# Patient Record
Sex: Female | Born: 1950 | Race: White | Hispanic: No | Smoking: Former smoker
Health system: Southern US, Community
[De-identification: ages and names within clinical notes are randomized; demographics above are authoritative.]

## PROBLEM LIST (undated history)

## (undated) DIAGNOSIS — M25569 Pain in unspecified knee: Secondary | ICD-10-CM

## (undated) DIAGNOSIS — R29898 Other symptoms and signs involving the musculoskeletal system: Secondary | ICD-10-CM

## (undated) DIAGNOSIS — R809 Proteinuria, unspecified: Secondary | ICD-10-CM

## (undated) DIAGNOSIS — G43009 Migraine without aura, not intractable, without status migrainosus: Secondary | ICD-10-CM

## (undated) DIAGNOSIS — F419 Anxiety disorder, unspecified: Secondary | ICD-10-CM

## (undated) DIAGNOSIS — M1990 Unspecified osteoarthritis, unspecified site: Secondary | ICD-10-CM

## (undated) DIAGNOSIS — N281 Cyst of kidney, acquired: Secondary | ICD-10-CM

## (undated) DIAGNOSIS — F329 Major depressive disorder, single episode, unspecified: Secondary | ICD-10-CM

## (undated) DIAGNOSIS — J40 Bronchitis, not specified as acute or chronic: Secondary | ICD-10-CM

## (undated) DIAGNOSIS — C801 Malignant (primary) neoplasm, unspecified: Secondary | ICD-10-CM

## (undated) DIAGNOSIS — T7840XA Allergy, unspecified, initial encounter: Secondary | ICD-10-CM

## (undated) DIAGNOSIS — M545 Low back pain, unspecified: Secondary | ICD-10-CM

## (undated) DIAGNOSIS — F32A Depression, unspecified: Secondary | ICD-10-CM

## (undated) DIAGNOSIS — J45909 Unspecified asthma, uncomplicated: Secondary | ICD-10-CM

## (undated) DIAGNOSIS — E785 Hyperlipidemia, unspecified: Secondary | ICD-10-CM

## (undated) DIAGNOSIS — L03317 Cellulitis of buttock: Secondary | ICD-10-CM

## (undated) DIAGNOSIS — M25519 Pain in unspecified shoulder: Secondary | ICD-10-CM

## (undated) DIAGNOSIS — M542 Cervicalgia: Secondary | ICD-10-CM

## (undated) DIAGNOSIS — I10 Essential (primary) hypertension: Secondary | ICD-10-CM

## (undated) DIAGNOSIS — G4700 Insomnia, unspecified: Secondary | ICD-10-CM

## (undated) DIAGNOSIS — R413 Other amnesia: Secondary | ICD-10-CM

## (undated) DIAGNOSIS — L0231 Cutaneous abscess of buttock: Secondary | ICD-10-CM

## (undated) HISTORY — DX: Hyperlipidemia, unspecified: E78.5

## (undated) HISTORY — PX: CYSTECTOMY: SUR359

## (undated) HISTORY — PX: CARPAL TUNNEL RELEASE: SHX101

## (undated) HISTORY — DX: Cutaneous abscess of buttock: L02.31

## (undated) HISTORY — PX: FOOT SURGERY: SHX648

## (undated) HISTORY — DX: Major depressive disorder, single episode, unspecified: F32.9

## (undated) HISTORY — PX: TOTAL KNEE ARTHROPLASTY: SHX125

## (undated) HISTORY — PX: TUBAL LIGATION: SHX77

## (undated) HISTORY — PX: EYE SURGERY: SHX253

## (undated) HISTORY — DX: Low back pain, unspecified: M54.50

## (undated) HISTORY — DX: Cervicalgia: M54.2

## (undated) HISTORY — DX: Allergy, unspecified, initial encounter: T78.40XA

## (undated) HISTORY — DX: Depression, unspecified: F32.A

## (undated) HISTORY — PX: CATARACT EXTRACTION: SUR2

## (undated) HISTORY — DX: Other amnesia: R41.3

## (undated) HISTORY — DX: Unspecified asthma, uncomplicated: J45.909

## (undated) HISTORY — DX: Cyst of kidney, acquired: N28.1

## (undated) HISTORY — PX: KIDNEY SURGERY: SHX687

## (undated) HISTORY — PX: COLONOSCOPY: SHX174

## (undated) HISTORY — DX: Proteinuria, unspecified: R80.9

## (undated) HISTORY — DX: Other symptoms and signs involving the musculoskeletal system: R29.898

## (undated) HISTORY — PX: OTHER SURGICAL HISTORY: SHX169

## (undated) HISTORY — DX: Low back pain: M54.5

## (undated) HISTORY — DX: Unspecified osteoarthritis, unspecified site: M19.90

## (undated) HISTORY — PX: ABDOMINAL HYSTERECTOMY: SHX81

## (undated) HISTORY — DX: Pain in unspecified knee: M25.569

## (undated) HISTORY — DX: Cellulitis of buttock: L03.317

## (undated) HISTORY — DX: Insomnia, unspecified: G47.00

## (undated) HISTORY — DX: Anxiety disorder, unspecified: F41.9

## (undated) HISTORY — DX: Bronchitis, not specified as acute or chronic: J40

## (undated) HISTORY — DX: Migraine without aura, not intractable, without status migrainosus: G43.009

## (undated) HISTORY — DX: Essential (primary) hypertension: I10

## (undated) HISTORY — DX: Pain in unspecified shoulder: M25.519

---

## 1998-08-06 ENCOUNTER — Other Ambulatory Visit: Admission: RE | Admit: 1998-08-06 | Discharge: 1998-08-06 | Payer: Self-pay | Admitting: Family Medicine

## 2000-12-07 ENCOUNTER — Ambulatory Visit (HOSPITAL_BASED_OUTPATIENT_CLINIC_OR_DEPARTMENT_OTHER): Admission: RE | Admit: 2000-12-07 | Discharge: 2000-12-07 | Payer: Self-pay | Admitting: Surgery

## 2000-12-07 ENCOUNTER — Encounter (INDEPENDENT_AMBULATORY_CARE_PROVIDER_SITE_OTHER): Payer: Self-pay | Admitting: *Deleted

## 2002-02-07 ENCOUNTER — Encounter: Payer: Self-pay | Admitting: Emergency Medicine

## 2002-02-08 ENCOUNTER — Inpatient Hospital Stay (HOSPITAL_COMMUNITY): Admission: EM | Admit: 2002-02-08 | Discharge: 2002-02-09 | Payer: Self-pay | Admitting: Emergency Medicine

## 2004-10-14 ENCOUNTER — Inpatient Hospital Stay (HOSPITAL_COMMUNITY): Admission: RE | Admit: 2004-10-14 | Discharge: 2004-10-17 | Payer: Self-pay | Admitting: Orthopedic Surgery

## 2005-08-25 ENCOUNTER — Ambulatory Visit (HOSPITAL_COMMUNITY): Admission: RE | Admit: 2005-08-25 | Discharge: 2005-08-25 | Payer: Self-pay | Admitting: Family Medicine

## 2005-09-10 ENCOUNTER — Ambulatory Visit (HOSPITAL_COMMUNITY): Admission: RE | Admit: 2005-09-10 | Discharge: 2005-09-10 | Payer: Self-pay | Admitting: Family Medicine

## 2006-08-10 ENCOUNTER — Encounter (INDEPENDENT_AMBULATORY_CARE_PROVIDER_SITE_OTHER): Payer: Self-pay | Admitting: Specialist

## 2006-08-10 ENCOUNTER — Ambulatory Visit (HOSPITAL_COMMUNITY): Admission: RE | Admit: 2006-08-10 | Discharge: 2006-08-10 | Payer: Self-pay | Admitting: Urology

## 2006-08-31 ENCOUNTER — Emergency Department (HOSPITAL_COMMUNITY): Admission: EM | Admit: 2006-08-31 | Discharge: 2006-08-31 | Payer: Self-pay | Admitting: Emergency Medicine

## 2006-10-18 ENCOUNTER — Emergency Department (HOSPITAL_COMMUNITY): Admission: EM | Admit: 2006-10-18 | Discharge: 2006-10-18 | Payer: Self-pay | Admitting: Emergency Medicine

## 2006-11-06 ENCOUNTER — Encounter (INDEPENDENT_AMBULATORY_CARE_PROVIDER_SITE_OTHER): Payer: Self-pay | Admitting: Interventional Radiology

## 2006-11-06 ENCOUNTER — Ambulatory Visit (HOSPITAL_COMMUNITY): Admission: RE | Admit: 2006-11-06 | Discharge: 2006-11-06 | Payer: Self-pay | Admitting: Urology

## 2006-12-01 ENCOUNTER — Encounter: Admission: RE | Admit: 2006-12-01 | Discharge: 2006-12-01 | Payer: Self-pay | Admitting: Interventional Radiology

## 2006-12-30 ENCOUNTER — Ambulatory Visit (HOSPITAL_COMMUNITY): Admission: RE | Admit: 2006-12-30 | Discharge: 2006-12-30 | Payer: Self-pay | Admitting: Urology

## 2007-06-17 HISTORY — PX: CERVICAL FUSION: SHX112

## 2007-11-18 ENCOUNTER — Emergency Department (HOSPITAL_COMMUNITY): Admission: EM | Admit: 2007-11-18 | Discharge: 2007-11-18 | Payer: Self-pay | Admitting: Emergency Medicine

## 2008-01-04 ENCOUNTER — Encounter: Admission: RE | Admit: 2008-01-04 | Discharge: 2008-01-04 | Payer: Self-pay | Admitting: Internal Medicine

## 2008-03-26 ENCOUNTER — Emergency Department (HOSPITAL_COMMUNITY): Admission: EM | Admit: 2008-03-26 | Discharge: 2008-03-26 | Payer: Self-pay | Admitting: Emergency Medicine

## 2008-04-01 ENCOUNTER — Encounter: Admission: RE | Admit: 2008-04-01 | Discharge: 2008-04-01 | Payer: Self-pay | Admitting: Family Medicine

## 2008-05-24 ENCOUNTER — Inpatient Hospital Stay (HOSPITAL_COMMUNITY): Admission: RE | Admit: 2008-05-24 | Discharge: 2008-05-26 | Payer: Self-pay | Admitting: Neurosurgery

## 2008-09-26 ENCOUNTER — Encounter: Admission: RE | Admit: 2008-09-26 | Discharge: 2008-09-26 | Payer: Self-pay | Admitting: Family Medicine

## 2009-06-16 HISTORY — PX: SPINE SURGERY: SHX786

## 2009-08-18 ENCOUNTER — Emergency Department (HOSPITAL_COMMUNITY): Admission: EM | Admit: 2009-08-18 | Discharge: 2009-08-18 | Payer: Self-pay | Admitting: Emergency Medicine

## 2009-10-16 IMAGING — CR DG CERVICAL SPINE 2 OR 3 VIEWS
1 series · 1 of 1 positions shown · non-contrast
Comparison: none

CLINICAL DATA: Cervical fusion

CERVICAL SPINE - 2-3 VIEW

[view not recorded]
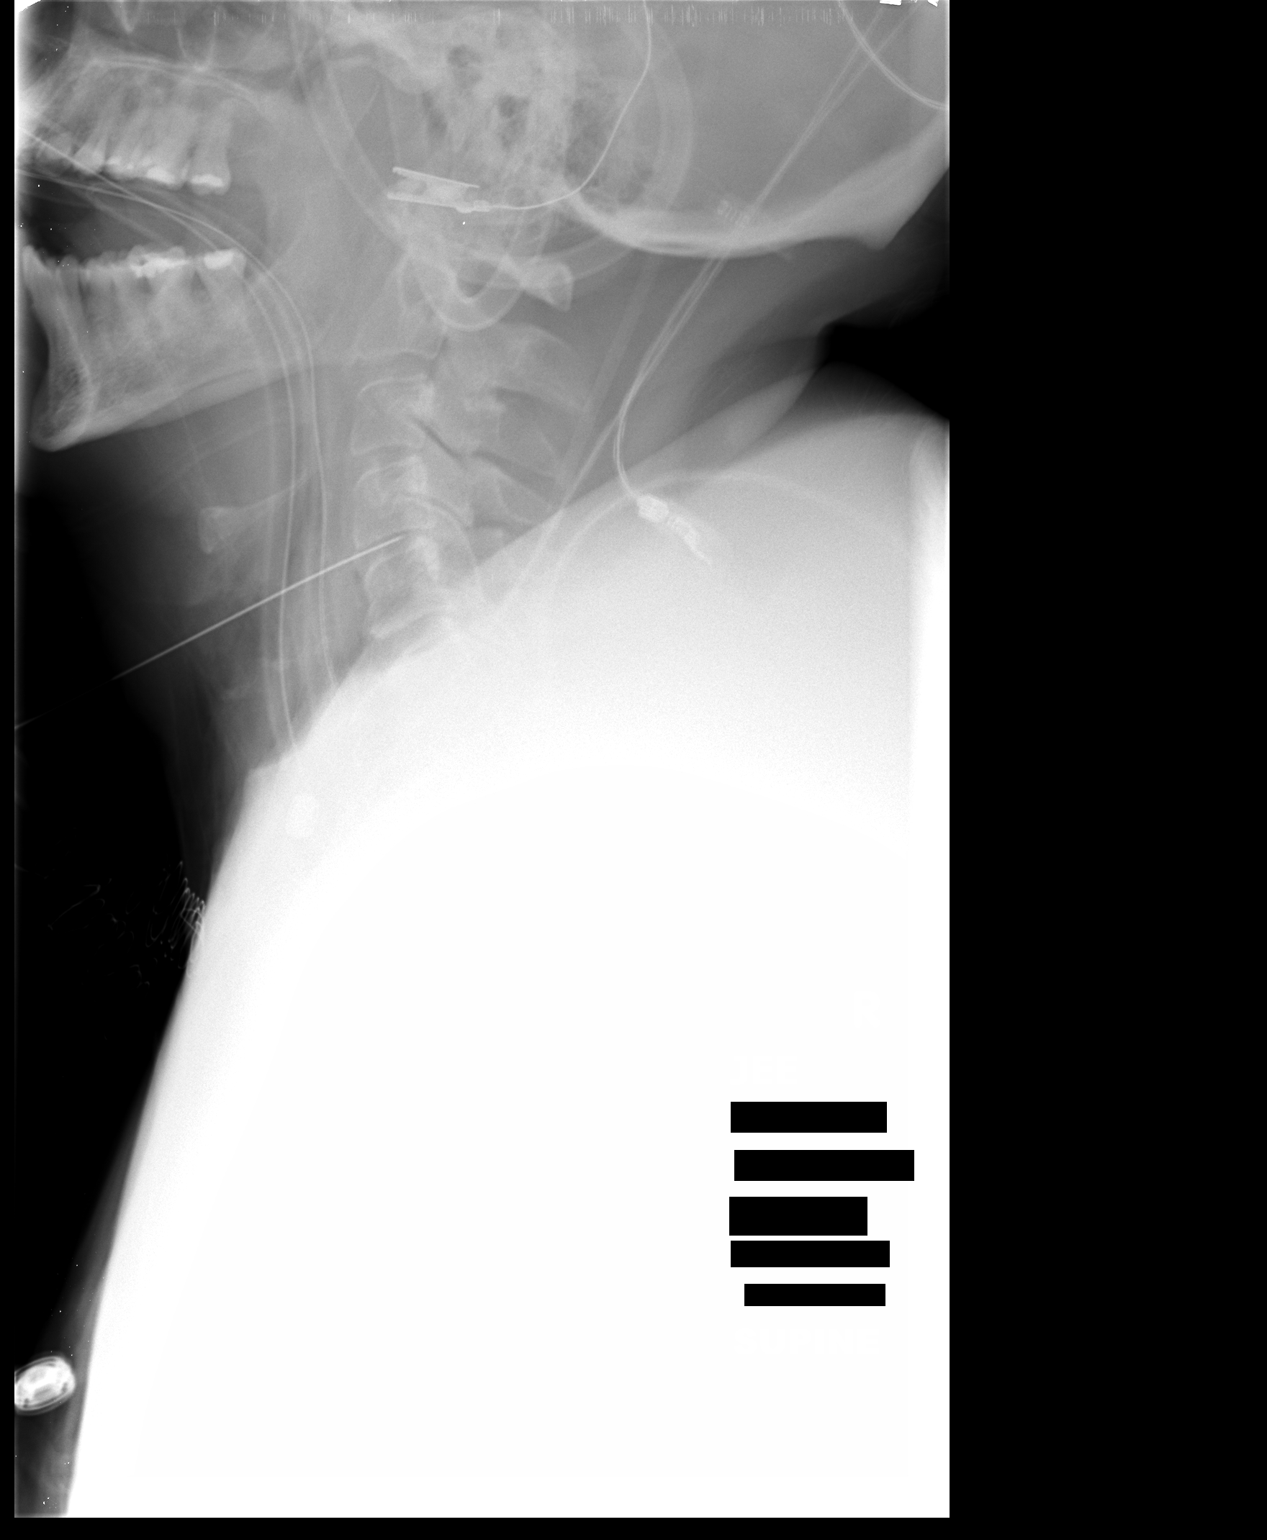

[1 of 1 positions shown; findings below may reference images not displayed]

FINDINGS: On the initial film, a needle has been utilized to mark
the C4-5 disc.  On the second film, plates and screws are seen in
C4-C5 and C6.  The plate extends toward C7 but the vertebral body
is obscured.  Interposed tubular bone graft is present in the C4-5
disc. Anatomic alignment.  No breakage of the hardware.
IMPRESSION: Anterior fusion C4-C7.

## 2009-10-16 IMAGING — CR DG CHEST 2V
2 series · 2 of 2 positions shown · non-contrast
Comparison: 10/10/2004

CLINICAL DATA: Preoperative respiratory exam.  Cervical spondylosis
and cervical radiculopathy.

CHEST - 2 VIEW

[view not recorded (1 of 2)]
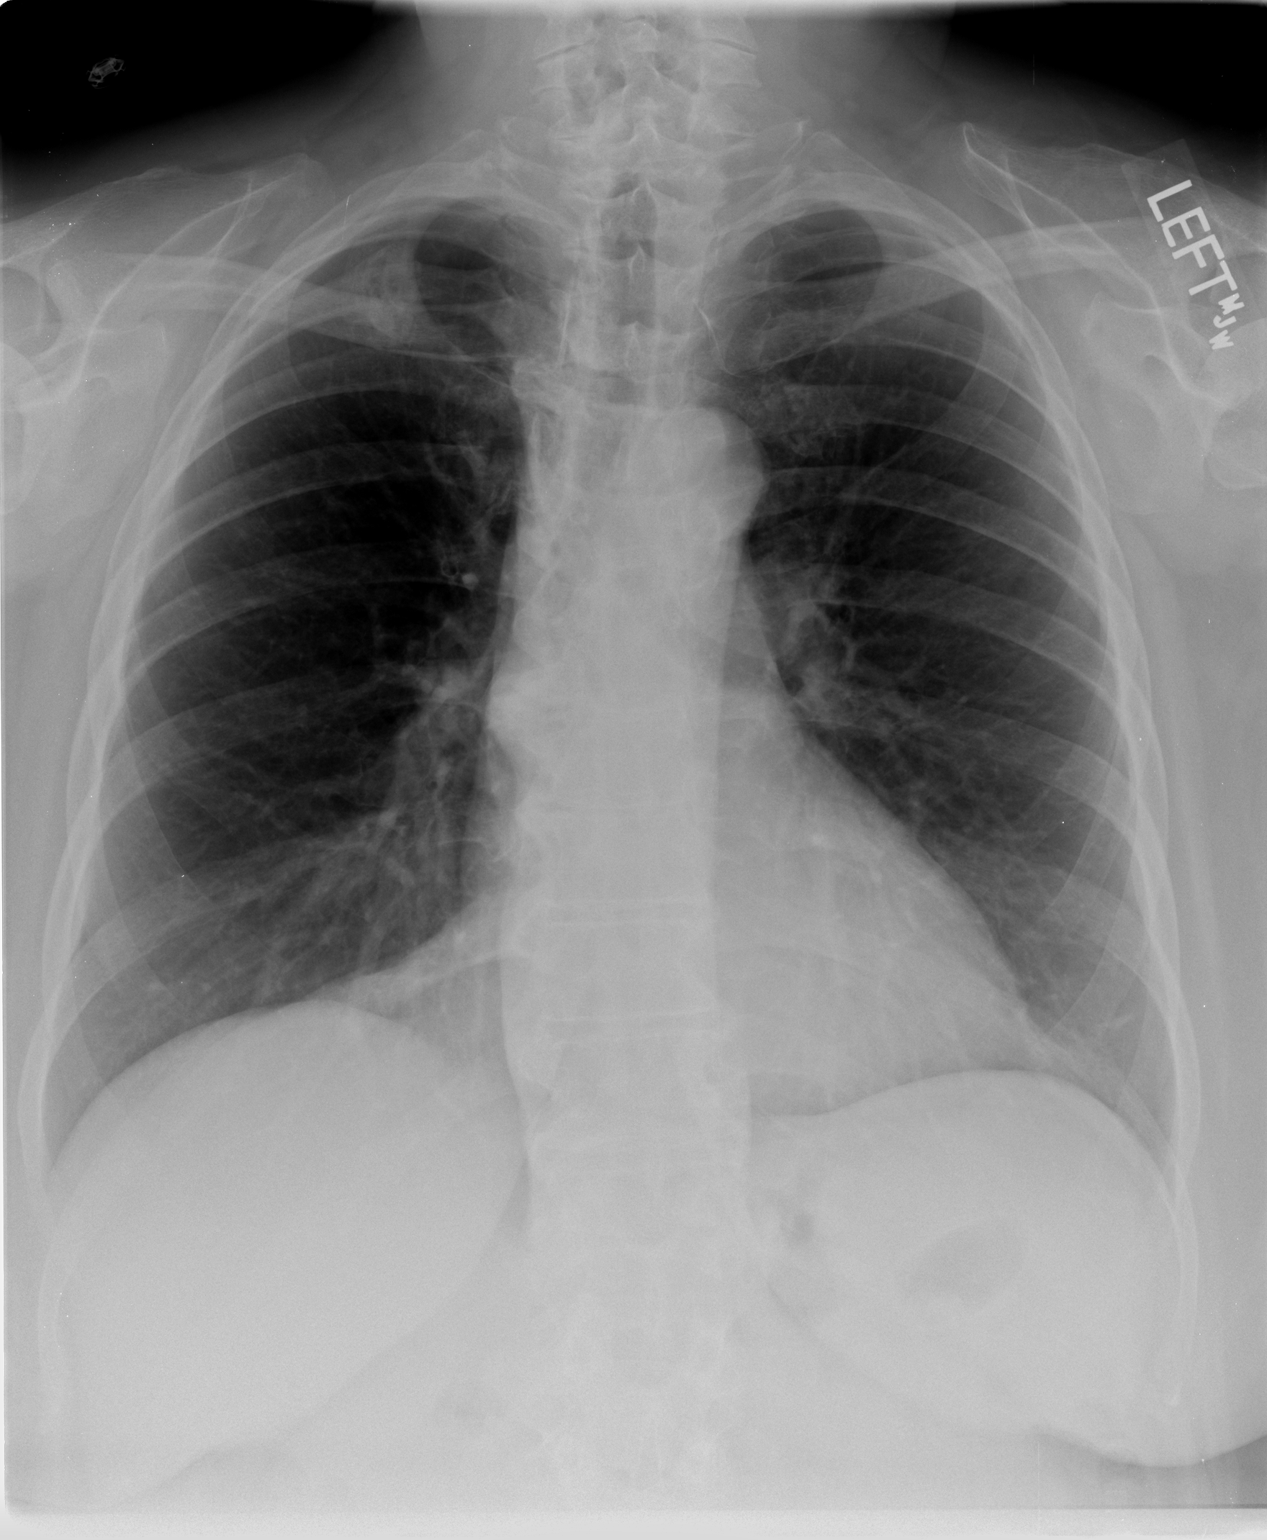

[view not recorded (2 of 2)]
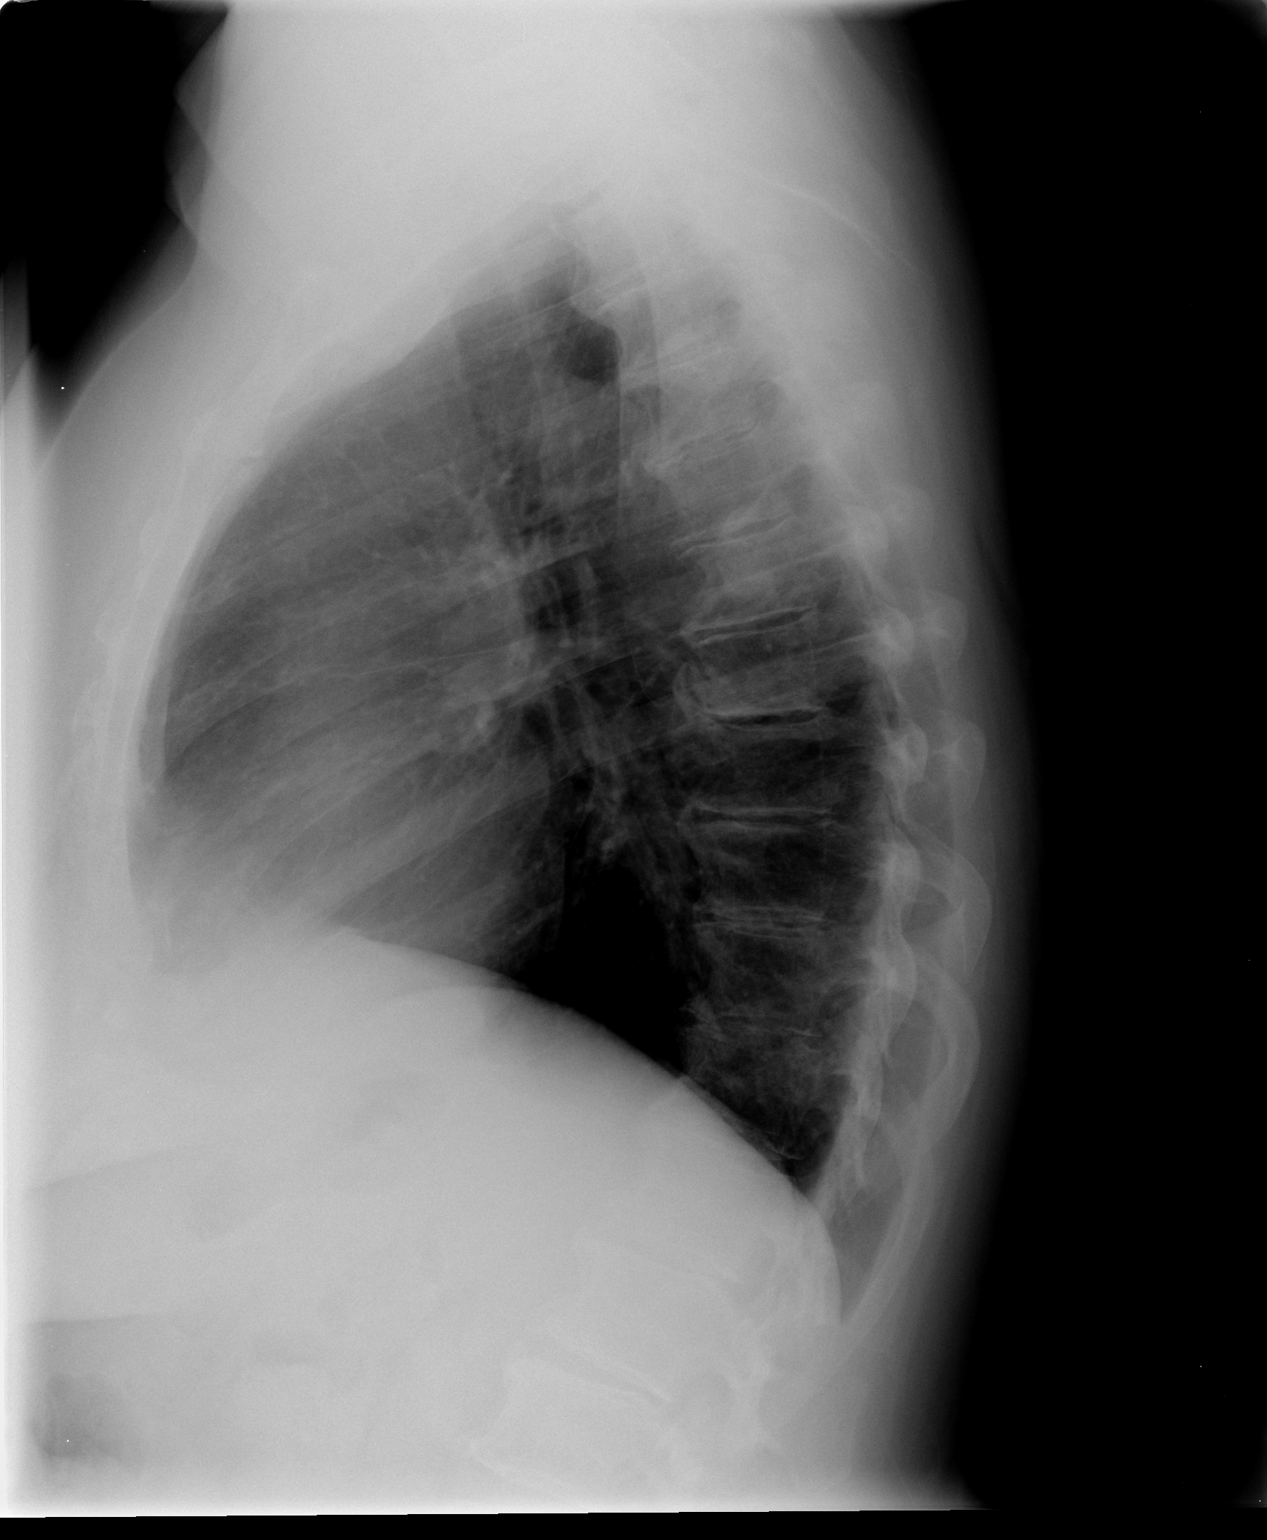

[2 of 2 positions shown; findings below may reference images not displayed]

FINDINGS: The heart size and vascularity are normal.  There is an
ill-defined area of density over the anterior aspect of the right
first rib which is new since the prior exam.  I feel this is most
likely artifactual given this density and configuration.  It is
unlikely to represent a pulmonary mass. However, sclerotic
metastasis or exuberant callus formation from prior rib fracture
could give this appearance as well.

The lungs are clear.  No effusions
IMPRESSION: .  No acute abnormalities.  Ill-defined density over the right
first rib is indeterminate. An apical lordotic view of the chest is
recommended for further evaluation.

## 2010-03-07 ENCOUNTER — Emergency Department (HOSPITAL_COMMUNITY)
Admission: EM | Admit: 2010-03-07 | Discharge: 2010-03-07 | Payer: Self-pay | Source: Home / Self Care | Admitting: Emergency Medicine

## 2010-03-21 ENCOUNTER — Encounter: Admission: RE | Admit: 2010-03-21 | Discharge: 2010-03-21 | Payer: Self-pay | Admitting: Neurosurgery

## 2010-04-02 ENCOUNTER — Inpatient Hospital Stay (HOSPITAL_COMMUNITY): Admission: RE | Admit: 2010-04-02 | Discharge: 2010-04-04 | Payer: Self-pay | Admitting: Neurosurgery

## 2010-04-02 ENCOUNTER — Encounter (INDEPENDENT_AMBULATORY_CARE_PROVIDER_SITE_OTHER): Payer: Self-pay | Admitting: Neurosurgery

## 2010-07-07 ENCOUNTER — Encounter: Payer: Self-pay | Admitting: Urology

## 2010-08-28 LAB — CBC
HCT: 44 % (ref 36.0–46.0)
MCHC: 34.8 g/dL (ref 30.0–36.0)
RDW: 12.4 % (ref 11.5–15.5)
WBC: 5.8 10*3/uL (ref 4.0–10.5)

## 2010-08-28 LAB — GLUCOSE, CAPILLARY
Glucose-Capillary: 101 mg/dL — ABNORMAL HIGH (ref 70–99)
Glucose-Capillary: 107 mg/dL — ABNORMAL HIGH (ref 70–99)
Glucose-Capillary: 107 mg/dL — ABNORMAL HIGH (ref 70–99)
Glucose-Capillary: 123 mg/dL — ABNORMAL HIGH (ref 70–99)
Glucose-Capillary: 162 mg/dL — ABNORMAL HIGH (ref 70–99)
Glucose-Capillary: 89 mg/dL (ref 70–99)
Glucose-Capillary: 91 mg/dL (ref 70–99)
Glucose-Capillary: 92 mg/dL (ref 70–99)

## 2010-08-28 LAB — SURGICAL PCR SCREEN
MRSA, PCR: NEGATIVE
Staphylococcus aureus: POSITIVE — AB

## 2010-08-28 LAB — BASIC METABOLIC PANEL
CO2: 30 mEq/L (ref 19–32)
Calcium: 10 mg/dL (ref 8.4–10.5)
Glucose, Bld: 92 mg/dL (ref 70–99)
Sodium: 140 mEq/L (ref 135–145)

## 2010-10-21 ENCOUNTER — Other Ambulatory Visit: Payer: Self-pay | Admitting: Dermatology

## 2010-10-29 NOTE — Op Note (Signed)
Shannon Garner, QUASHIE NO.:  [id number]   MEDICAL RECORD NO.:  [id number]          PATIENT TYPE:  INP   LOCATION:  3031                         FACILITY:  [facility]   PHYSICIAN:  Hilda Lias, M.D.   DATE OF BIRTH:  [birth year]   DATE OF PROCEDURE:  05/24/2008  DATE OF DISCHARGE:                               OPERATIVE REPORT   PREOPERATIVE DIAGNOSIS:  Cervical spondylosis with chronic radiculopathy  at C4-C5, C5-C6, C6-C7, borderline between C7-T1.   POSTOPERATIVE DIAGNOSIS:  Cervical spondylosis with chronic  radiculopathy at C4-C5, C5-C6, C6-C7, borderline between C7-T1.   PROCEDURES:  1. Anterior C4-C5, C5-C6, and C6-C7 diskectomy.  2. Decompression of the spinal cord.  3. Bilateral foraminotomy.  4. Interbody body fusion with allograft and autograft plate from C4-      C7, microscope.   SURGEON:  Hilda Lias, MD   CLINICAL HISTORY:  Ms. Shannon Garner is a lady who had been complaining of neck  pain with radiation to both upper extremities.  X-ray showed that she  had severe spondylosis at the level of C4-C5, C5-C6, C6-C7, and  borderline between C7-T1.  The patient had failed with conservative  treatment.  She want to proceed with surgery.  The risks were explained  to her in the history and physical.  The decision was made to do 3 level  and making a decision about  C7-T1 during surgery.   PROCEDURE:  The patient was taken to the OR and after intubation, the  left side of the neck was cleaned with DuraPrep.  The patient had a  short neck.  Longitudinal incision was made through the skin and  platysma, all the way down to the cervical spine.  The patient has a  large osteophyte.  We did x-ray that indeed showed that we were at C4-  C5.  Then, the anterior osteophyte at the level of C4-C5, C5-C6, and C6-  C7, were removed.  We opened the anterior ligament at the level of C4-C5  and we brought the microscope into the area.  The patient had quite a  bit of  degenerative disk disease with quite a bit of spondylosis.  With  the drill, we drilled all the way posteriorly until we were able to  decompress the spinal cord as well as the foramen.  At the level of C5-  C6, we found a mix of herniated disk, central to the left as well as to  the right with spondylosis.  After we opened the posterior ligament,  decompression was achieved and we had good decompression for the spinal  cord and the nerve root.  The same procedure was done at the level of C6-  C7 were most of the findings were bilateral into the foramen with a  central ridge with quite a bit of narrowing of the canal.  Because of  the amount of pathology we found, we decided not to proceed with the C7-  T1.  Then, the endplates were drilled.  Then, 3 allografts of 7 mm with  autograft inside and DBX were inserted.  This  was followed by a plate  using a total of 8 screws.  Lateral cervical spine x-rays showed  good position of the plate as well as the screws.  The area was  irrigated and although we achieved good hemostasis, nevertheless, we  decided to leave a drain in the prevertebral area.  The wound was closed  using Vicryl and Steri-Strip.  The patient is going to go to the  recovery room.           ______________________________  Hilda Lias, M.D.     EB/MEDQ  D:  05/24/2008  T:  05/25/2008  Job:  [id number]

## 2010-11-01 NOTE — Cardiovascular Report (Signed)
Shannon Garner, Shannon Garner                            ACCOUNT NO.:  [id number]   MEDICAL RECORD NO.:  [id number]                   PATIENT TYPE:  OUT   LOCATION:  CATH                                 FACILITY:  [facility]   PHYSICIAN:  Vonna Kotyk R. Jacinto Halim, M.D.                  DATE OF BIRTH:  30-Jan-1951   DATE OF PROCEDURE:  DATE OF DISCHARGE:                              CARDIAC CATHETERIZATION   PROCEDURE PERFORMED:  1. Left ventriculography.  2. Selective right and left coronary arteriography.  3. Selective renal arteriography.  4. Right femoral angiography and closure of right femoral artery access with     Perclose.   INDICATION:  The patient is a 60 year old female with a history of  hypertension, smoking, obesity, diabetes, who was admitted to [facility] with chest pain.  Given her multiple cardiac risk factors and  because of ongoing chest pain, she was brought directly to the cardiac  catheterization laboratory to evaluate her coronary anatomy to evaluate for  suspected coronary artery disease.   HEMODYNAMIC DATA:  The left ventricular pressure was 151 with end-diastolic  pressure of 13 mmHg. The aortic pressure 151/80 with a mean of 105 mmHg.  There was no pressure gradient across the aortic valve.   ANGIOGRAPHIC DATA:   LEFT VENTRICULOGRAM:  The left ventricular systolic function was normal with  an EF of 55-60%. There was no significant mitral regurgitation.   Right coronary artery:  The right coronary artery is a large caliber vessel  and dominant vessel. It gives origin to a large PDA and PLV branches. It is  normal.   Left main coronary artery:  The left main coronary artery is a large caliber  vessel. It is disease-free.   Left circumflex:  Left circumflex is a large caliber vessel and gives a AV  groove branch and continues in the AV groove after giving origin to a very  large obtuse marginal #1. It is normal.   Left anterior descending artery:  Left anterior  descending artery is a large  caliber vessel. It ends just at the apex.  It gives origin to a small  diagonal #1 and a large diagonal #2.  It is normal.   Selective bilateral renal arteriography:  Selective renal arteriography  revealed presence of single renal artery in either side. There was no  evidence of renal artery stenosis and it was normal.   IMPRESSION:  1. Normal coronary arteries.  2. Normal left ventricular systolic function.  3. Chest pain probably secondary to gastroesophageal reflux disease.   RECOMMENDATIONS:  Continue risk factor modification as indicated.  Evaluation for noncardiac cause of chest pain, probably gastroesophageal  reflux disease may be indicated.   TECHNIQUE OF PROCEDURE:  Under the usual sterile precautions, using a 6  French right femoral artery access and a 6 [place] multipurpose B2 catheter  was advanced into the  ascending aorta over a 0.035 inch J wire. The catheter  was gently advanced in the left ventricle, and left ventricular pressures  were monitored. Hand contrast injection of the left ventricular was  performed, both in LAO and RAO projections. Then the catheter was flushed  with saline and pulled back into the ascending aorta and pressure gradient  across the aortic valve was monitored.  The right coronary artery was  selectively engaged and angiography was performed.  In a similar fashion,  the left main coronary artery was selectively engaged and angiography was  performed. Then the catheter was pulled back into the abdominal aorta and  selective right and left renal arteriography was performed. Then the  catheter was pulled out of the body.  Right femoral angiography was  performed through the arterial access sheath and the axis was closed with  Perclose.  Adequate hemostasis was obtained and the patient was transferred to recovery  in a stable condition.                                                  Cristy Hilts. Jacinto Halim, M.D.     Pilar Plate  D:  02/08/2002  T:  02/09/2002  Job:  [id number]   cc:   Rosanne Sack, M.D.  Fax: [phone]

## 2010-11-01 NOTE — H&P (Signed)
NAMEAARINI, SLEE                            ACCOUNT NO.:  [id number]   MEDICAL RECORD NO.:  [id number]                   PATIENT TYPE:  INP   LOCATION:  0375                                 FACILITY:  [facility]   PHYSICIAN:  Kristian Covey, M.D.            DATE OF BIRTH:  [birth year]   DATE OF ADMISSION:  02/08/2002  DATE OF DISCHARGE:                                HISTORY & PHYSICAL   CHIEF COMPLAINT:  Chest tightness.   HISTORY OF PRESENT ILLNESS:  This is a 60 year old married white female from  [place], [place] with a history of hypertension, hyperlipidemia,  and ongoing cigarette use, who presents with nausea and chest discomfort.  Her chest tightness started around 6 p.m. at work on 02/07/02, and was  located substernally with some radiation towards both shoulders.  She had  some associated nausea, but no dyspnea or diaphoresis.  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  She went to a local fire  station thinking her blood pressure was elevated.  Initial blood pressure  there was 140/100.  They apparently recommended EMS, but the patient refused  and drove herself here for evaluation.  Upon arrival to the ED, she was  placed on oxygen.  Her chest pain went away promptly, and she has not had  any recurrent since then.  She relates having previous cardiac workup with  what sounds like a Cardiolite stress test about 10 years ago by Dr. Corinda Gubler,  and states this was normal.   PAST MEDICAL HISTORY:  1. Hypertension.  2. Type 2 diabetes.  3. Degenerative arthritis.  4. Urge urinary incontinence.  5. Hyperlipidemia.   MEDICATIONS:  1. Detrol LA 4 mg q.d.  2. Reserpine 0.25 mg q.d.  3. Relafen 750 mg q.d.  4. Lasix, unknown dose.  5. Lopressor 25 mg q.d.   ALLERGIES:  KEFLEX causes a rash.   PAST SURGICAL HISTORY:  1. Right knee arthroscopy.  2. Left heel spur in 2001.  3. Partial hysterectomy in 1986 for menometorrhagia.   FAMILY HISTORY:  Mother died age 36 of coronary disease complications.  Father died at age 25 of colon cancer, also coronary disease in his 83's.  One brother died of cystic fibrosis.  She has two brothers, one with  coronary disease.  Paternal grandmother had cerebrovascular accident.  Maternal grandmother had diabetes.   SOCIAL HISTORY:  She has been married for 35 years.  This is her second  marriage.  She has one son from her second marriage who lives in [place].  Smokes one pack of cigarettes per day.  No alcohol use.  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   REVIEW OF SYMPTOMS:  She had some recent frequent bifrontal headaches, and  reports a prior history of migraines.  She denies any recent fevers, chills,  cough, pleuritic pain, abdominal pain, dysuria, or any  stool changes.   PHYSICAL EXAMINATION:  GENERAL:  She is an alert, obese, pleasant female in  no acute distress.  VITAL SIGNS:  Blood pressure 147/74, pulse 64, respirations 18, O2  saturation 92% on 2 L.  HEENT:  Pupils equal, round, reactive to light.  Oropharynx:  Tongue is  slightly dry.  No lesions noted.  NECK:  Supple without mass or bruit.  CHEST:  Clear.  HEART:  Distant heart sounds, regular rate and rhythm with no murmurs or  rubs.  Chest wall is nontender to palpation.  ABDOMEN:  Soft, nontender.  EXTREMITIES:  No edema, 2+ dorsalis pedis  pulses bilaterally.  PELVIC:  Deferred at this time.  RECTAL:  Deferred at this time.  NEUROLOGIC:  Cranial nerves II-XII are normal.  Strength is 5+/5+  throughout.   LABORATORY DATA:  EKG shows nonspecific ST-T changes.  CK 166, MB 3.1,  troponin initial level was normal.  Hemoglobin 15.5, white blood cell count  9.6.  Electrolytes:  Sodium 140, potassium 2.8, BUN 12, creatinine 0.6,  glucose 127.   IMPRESSION:  This is a 60 year old white female with multiple risk factors  for coronary disease, including hypertension, type 2 diabetes,  hyperlipidemia, and  a strong family history, who now presents with  substernal tightness.  Fortunately, her initial enzymes are negative, but  her history is more worrisome, particularly in view of her multiple risk  factors.  She has hypokalemia which is probably secondary to chronic  diuretic use.   PLAN:  Rule out MI, see protocol sheet.  Will replace potassium orally and  follow lytes closely.  Consider a cardiology consult.  I think she will  probably need to be re-studied as her last study was reportedly over 10  years ago.  She needs modification of multiple risk factors, including  obesity, smoking, and lipids.  We will hold her Lasix and reserpine for now,  but may need additional blood pressure medication in addition to Lopressor.                                               Kristian Covey, M.D.    BWB/MEDQ  D:  02/08/2002  T:  02/08/2002  Job:  [id number]   cc:   Rema Fendt, M.D.  [facility]

## 2010-11-01 NOTE — H&P (Signed)
Shannon Garner, BERKEY NO.:  [id number]   MEDICAL RECORD NO.:  [id number]          PATIENT TYPE:  INP   LOCATION:  NA                           FACILITY[facility]   PHYSICIAN:  Ollen Gross, M.D.    DATE OF BIRTH:  [birth year]   DATE OF ADMISSION:  10/15/2004  DATE OF DISCHARGE:                                HISTORY & PHYSICAL   CHIEF COMPLAINT:  Right knee pain.   HISTORY OF PRESENT ILLNESS:  The patient is a 60 year old female seen by Dr.  Ollen Gross for bilateral knee pain, right knee has been more symptomatic  than the left. She did not recall any specific injury. 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 She had a previous  arthroscopy in the past which helped for a while but the pain has come back.  She is seen in the office and found to have end-stage medial compartment  arthritis in the right knee with patella femoral involvement. She has bone-  on-bone in both compartments. It is felt due to the significant findings  that she would benefit from undergoing total knee replacement. Risks and  benefits have been discussed. The patient is subsequently admitted to the  hospital.   ALLERGIES:  KEFLEX causing itches.   CURRENT MEDICATIONS:  1.  Avandia 8 mg 1/2 tablet p.o. daily.  2.  Triamterene/hydrochlorothiazide 75/50 mg 1/2 tablet p.o. daily.  3.  Dobutamine 750 mg 2 tablets daily.  4.  Lipitor 10 mg daily.  5.  Metformin 750 mg ER daily.  6.  Metoprolol 50 mg daily.  7.  Lexapro 10 mg 1/2 tablet daily.  8.  Hyoscyamine 0.375 mg b.i.d.  9.  Wellbutrin b.i.d. (does not remember dose).   PAST MEDICAL HISTORY:  1.  Migraines.  2.  Anxiety.  3.  Bronchitis.  4.  History of hypokalemia.  5.  Hypertension.  6.  Reflux disease.  7.  Hemorrhoids.  8.  Irritable bladder, noninsulin-dependent diabetes mellitus.  9.  Postmenopausal.   PAST SURGICAL HISTORY:  1.  Pilonidal cyst excision x2.  2.  Hysterectomy in the 1980s.  3.   Lymph node resection right groin.  4.  Right knee arthroscopy.  5.  Left heel surgery.  6.  She has undergone a cardiac catheterization which is normal.  7.  Bilateral carpal tunnel surgeries.   SOCIAL HISTORY:  Married. 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  A 20 year smoking history but she is down to 2 cigarettes a day. One son.  Husband will be assisting with care after surgery.   FAMILY HISTORY:  Mother was deceased at age 11 with history of heart disease  and arthritis. Father deceased at age 64 with history of cancer. Both  parents had hypertension. Grandparents with diabetes.   REVIEW OF SYMPTOMS:  GENERAL:  No fevers, chills, or night sweats.  NEUROLOGICAL:  No seizures, syncope, or paralysis. RESPIRATORY:  No  shortness of breath, productive cough, or hemoptysis. CARDIOVASCULAR:  No  chest pain, angina, or orthopnea. GI:  No nausea, vomiting, diarrhea, or  constipation. GU:  No dysuria, hematuria, or discharge, although she does  have some urinary frequency. MUSCULOSKELETAL:  Right knee as in the history  of present illness.   PHYSICAL EXAMINATION:  VITAL SIGNS:  Pulse 64, respiration 12, blood  pressure 140/88.  GENERAL:  A 60 year old white female well-developed, well-nourished in no  acute distress. Alert, oriented, and cooperative very pleasant and a good  historian. Slightly overweight and in no acute distress.  HEENT:  Normocephalic and atraumatic. Pupils are equal, round, and reactive  to light. Oropharynx clear. EOMs are intact. She is noted to wear glasses.  NECK:  Supple. No carotid bruits.  CHEST:  Clear anterior and posterior chest wall.  HEART:  Regular rate and rhythm. S1 and S2 noted.  ABDOMEN:  Soft and nontender. Bowel sounds present.  RECTAL:  Not done, not pertinent to present illness.  BREASTS:  Not done, not pertinent to present illness.  GENITALIA:  Not done, not pertinent to present illness.  EXTREMITIES:  Right knee does show a varus  malalignment deformity. No  effusion. Range of motion 10 to 120 degrees marked crepitus. Left lower  extremity knee shows no deformity, moderate crepitus 5 to 120 degrees.   IMPRESSION:  1.  Osteoarthritis bilateral knees, right more symptomatic than left.  2.  Migraines.  3.  Anxiety.  4.  History of bronchitis.  5.  History of hypokalemia.  6.  Hypertension.  7.  Reflux disease.  8.  Hemorrhoids.  9.  History of irritable bladder.  10. Noninsulin-dependent diabetes mellitus.  11. Postmenopausal.   PLAN:  The patient will be admitted to [facility] to undergo a  right total knee arthroplasty. Surgery will be performed by Dr. Ollen Gross.      ALP/MEDQ  D:  10/13/2004  T:  10/13/2004  Job:  [id number]   cc:   Ollen Gross, M.D.  [facility]  [address]  [address]  [place]  [place] [address]  Fax: [phone]

## 2010-11-01 NOTE — Discharge Summary (Signed)
Shannon Garner, Shannon Garner NO.:  [id number]   MEDICAL RECORD NO.:  [id number]          PATIENT TYPE:  INP   LOCATION:  0476                         FACILITY:  [facility]   PHYSICIAN:  Ollen Gross, M.D.    DATE OF BIRTH:  1951-05-14   DATE OF ADMISSION:  10/14/2004  DATE OF DISCHARGE:  10/17/2004                                 DISCHARGE SUMMARY   ADMISSION DIAGNOSES:  1.  Osteoarthritis, bilateral knees, right more symptomatic than left.  2.  Migraines.  3.  Anxiety.  4.  History of bronchitis.  5.  History of hypokalemia.  6.  Hypertension.  7.  Reflux disease.  8.  Hemorrhoids.  9.  History of irritable bladder.  10. Non-insulin-dependent diabetes mellitus.  11. Postmenopausal.   DISCHARGE DIAGNOSES:  1.  Osteoarthritis, right knee, status post right total knee arthroplasty.  2.  Postoperative hypokalemia improving.  3.  Migraines.  4.  Anxiety.  5.  History of bronchitis.  6.  History of hypokalemia.,  7.  Hypertension.  8.  Reflux disease.  9.  Hemorrhoids.  10. History of irritable bladder.  11. Non-insulin-dependent diabetes mellitus.  12. Postmenopausal.   PROCEDURE:  Oct 14, 2004 right total knee arthroplasty, surgeon Ollen Gross, M.D., assistant Avel Peace, P.A.-C., anesthesia general.  Tourniquet time 40 minutes at 300 mmHg.   BRIEF HISTORY:  Ms. Rudell is a 60 year old female with several year history  of progressive right knee pain. She noted it to be bone on bone medial as  well as patellofemoral compartments refractory to nonoperative management  and now presents for a total knee.   LABORATORY DATA:  CBC on admission showed a hemoglobin of 13.8, hematocrit  of 40.3, white cell count 5.5, differential within normal limits. Postop  hemoglobin 12.2, last noted H&H 11.1 and 32.3. PT and PTT preop 11.8 and 30  respectively with an INR of 0.8. Serial pro times followed, last noted PT  and INR 17.4 and 1.7. Chem panel on admission, low  potassium at 3.2,  elevated glucose of 135, remaining chem panel within normal limits. Serial  BMETs are followed. Potassium did come up to 3.4, remaining electrolytes  remained within normal limits. Preop UA, positive glucose otherwise  negative, blood group type O positive.   Preop chest on October 10, 2004 thoracic spondylosis, no acute findings.   HOSPITAL COURSE:  The patient was admitted to [facility] and taken  to the OR, underwent above procedure without complications. The patient  tolerated the procedure well, went to recovery room and then orthopedic  floor. Vital signs were followed. The patient was placed on PCA and p.o.  analgesics for pain control following surgery. He had a fair amount of pain  on the evening of surgery but doing a little bit better by day one, Hemovac  drain was pulled on day one, hemoglobin was 12.2. By day two, she was doing  a little bit better, did have a little bit of nausea on the evening of day  one but had improved by day two. Started getting up  ambulating with therapy,  ambulated approximately 40 feet that morning and 100 feet night. Pain pump  was removed at the time of dressing change, incision looked well, healing  well. All the IV's and PC and IV fluids were discontinued. She continued to  progress well with physical therapy and by day three she was up ambulating  100 feet, tolerating meds and was discharged home.   PLAN:  1.  The patient discharged home on Oct 17, 2004.  2.  Discharge diagnoses please see above.  3.  Discharge medications:  Coumadin, Percocet and Robaxin.  4.  Diet, resume previous home diet.  5.  Followup on May 16 or 18, call for an appointment time at [phone].  6.  Activity weightbearing as tolerated. Home health PT and home health      nursing. May start showering. Total knee protocol.   DISPOSITION:  Home.   CONDITION ON DISCHARGE:  Improved.       ALP/MEDQ  D:  11/22/2004  T:  11/22/2004  Job:   [id number]   cc:   Gloriajean Dell. Andrey Campanile, M.D.  [address]  [place]  [place] [address]  Fax: [phone]

## 2010-11-01 NOTE — Op Note (Signed)
NAMEROSARIA, KUBIN NO.:  [id number]   MEDICAL RECORD NO.:  [id number]          PATIENT TYPE:  INP   LOCATION:  0009                         FACILITY:  [facility]   PHYSICIAN:  Ollen Gross, M.D.    DATE OF BIRTH:  May 26, 1951   DATE OF PROCEDURE:  10/14/2004  DATE OF DISCHARGE:                                 OPERATIVE REPORT   PREOPERATIVE DIAGNOSIS:  Osteoarthritis, right knee.   POSTOPERATIVE DIAGNOSIS:  Osteoarthritis, right knee.   PROCEDURE:  Right total knee arthroplasty.   SURGEON:  Ollen Gross, M.D.   ASSISTANT:  Alexzandrew L. Julien Girt, P.A.   ANESTHESIA:  General with postop Marcaine pain pump.   ESTIMATED BLOOD LOSS:  Minimal.   DRAINS:  Hemovac x1.   TOURNIQUET TIME:  40 minutes at 3 mmHg.   COMPLICATIONS:  None.   CONDITION:  Stable to recovery.   CLINICAL NOTE:  [name] is a 60 year old female who has had a several year  history of progressively worsening right knee pain. She has developed bone  on bone change in the medial compartment as well as patellofemoral  compartment. She has had intractable pain refractory to nonoperative  management including injections. She presents now for right total knee  arthroplasty.   PROCEDURE IN DETAIL:  After successful administration of general anesthetic,  a tourniquet was placed on the right thigh and right lower extremity prepped  and draped in usual sterile fashion. The extremity was wrapped in Esmarch  tourniquet, the knee flexed and tourniquet inflated to 300 mmHg. Standard  midline incision is made with a 10 blade through subcutaneous tissue to the  level of the extensor mechanism. A fresh blade is used to make a medial  parapatellar arthrotomy and the soft tissue of the proximal medial tibia is  subperiosteally elevated to joint line with the knife and into the  semimembranous sub-bursa with a Cobb elevator. Soft tissue of the proximal  lateral tibia is also elevated with attention being  paid to avoid a patellar  tendon on tibial tubercle. Patella was everted, knee flexed 90 degrees, and  ACL and PCL removed. The drill was used to create a starting hole and the  distal femur canal was irrigated. A 5-degree right valgus alignment guide  was placed and the block on the distal femur is set for 10 mm resection.  Distal femoral resection is then made with an oscillating saw. A sizing  block is placed and size 3 is most appropriate. Rotation corresponds with  epicondylar axis. Size 3 cutting block is placed and the anterior posterior  chamfer cuts made.   Tibia is subluxed forward and menisci removed. Extramedullary tibial  alignment guide is placed referencing proximally at the medial aspect of the  tibial tubercle and distally along the second metatarsal axis and tibial  crest. The block is pinned to remove 10 mm of the non deficient lateral  side. Tibial resection is made with an oscillating saw. Size 3 is the most  appropriate tibial component and the proximal tibia was prepared the modular  drill and keel punch  for size 3. Femoral preparation is completed with the  intercondylar cut.   The size 3 mobile bearing tibial trial, size 3 posterior stabilized femoral  trial, and 10 mm posterior stabilized rotating platform inserted and trial  replaced. With a 10, she had a tiny bit of laxity in flexion with up to  12.5, which still allowed full extension with excellent varus and valgus  balance throughout full range of motion. The patella was then everted,  thickness measured to be 20 mm. Freehand resection is taken to 11 mm, 35  template is placed, lug holes were drilled, trial patella was placed and it  tracks normally. Osteophytes were then removed off the posterior femur with  the trial in place. All trials were removed and the cut bone surfaces were  prepared with pulsatile lavage. Cement is mixed and once ready for  reimplantation, the size 3 mobile bearing tibial tray,  size 3 posterior  stabilized femur, and 35 patella are cemented into place. The patella was  held with the clamp. Trial 15 mm insert is placed and the knee held in full  extension and all extruded cement removed. Once the cement is fully  hardened, the permanent 12.5 mm posterior stabilized rotating platform  insert is placed in the tibial tray. The wound was copiously irrigated with  saline solution and the extensor mechanism closed over a Hemovac drain with  interrupted #1 PDS. Flexion against gravity is about 140 degrees. Tourniquet  is released after a total time of 40 minutes. Minor bleeding stopped with  cautery. Subcu closed with interrupted 2-0 Vicryl, subcuticular running 4-0  Monocryl. Incisions cleaned and dried and Steri-Strips applied. The catheter  for the Marcaine pain pump was placed and the pump initiated. A bulky  sterile dressing is applied and she is placed into a knee immobilizer,  awakened and transported to recovery in stable condition.      FA/MEDQ  D:  10/14/2004  T:  10/14/2004  Job:  [id number]

## 2010-11-18 ENCOUNTER — Other Ambulatory Visit: Payer: Self-pay | Admitting: Dermatology

## 2010-12-31 ENCOUNTER — Other Ambulatory Visit (HOSPITAL_COMMUNITY): Payer: Self-pay | Admitting: Family Medicine

## 2010-12-31 ENCOUNTER — Other Ambulatory Visit: Payer: Self-pay | Admitting: Family Medicine

## 2010-12-31 DIAGNOSIS — Z1231 Encounter for screening mammogram for malignant neoplasm of breast: Secondary | ICD-10-CM

## 2010-12-31 DIAGNOSIS — M8580 Other specified disorders of bone density and structure, unspecified site: Secondary | ICD-10-CM

## 2011-01-08 ENCOUNTER — Ambulatory Visit: Payer: Self-pay

## 2011-01-22 ENCOUNTER — Ambulatory Visit (HOSPITAL_COMMUNITY)
Admission: RE | Admit: 2011-01-22 | Discharge: 2011-01-22 | Disposition: A | Discharge status: Home / Self Care | Payer: BC Managed Care – PPO | Source: Ambulatory Visit | Attending: Family Medicine | Admitting: Family Medicine

## 2011-01-22 ENCOUNTER — Other Ambulatory Visit (HOSPITAL_COMMUNITY): Payer: Self-pay | Admitting: Family Medicine

## 2011-01-22 DIAGNOSIS — Z1231 Encounter for screening mammogram for malignant neoplasm of breast: Secondary | ICD-10-CM

## 2011-01-22 DIAGNOSIS — M8580 Other specified disorders of bone density and structure, unspecified site: Secondary | ICD-10-CM

## 2011-03-13 LAB — URINALYSIS, ROUTINE W REFLEX MICROSCOPIC
Hgb urine dipstick: NEGATIVE
Protein, ur: NEGATIVE
Urobilinogen, UA: 0.2

## 2011-03-17 LAB — POCT I-STAT, CHEM 8
BUN: 12
Creatinine, Ser: 0.6
Hemoglobin: 15.6 — ABNORMAL HIGH
Potassium: 3.6
Sodium: 141

## 2011-03-21 LAB — CBC
HCT: 43.9 % (ref 36.0–46.0)
Hemoglobin: 15 g/dL (ref 12.0–15.0)
RBC: 4.73 MIL/uL (ref 3.87–5.11)
RDW: 13.1 % (ref 11.5–15.5)

## 2011-03-21 LAB — BASIC METABOLIC PANEL
CO2: 30 mEq/L (ref 19–32)
GFR calc non Af Amer: >60 mL/min (ref >60)
Glucose, Bld: 89 mg/dL (ref 70–99)
Potassium: 4.5 mEq/L (ref 3.5–5.1)
Sodium: 140 mEq/L (ref 135–145)

## 2011-03-21 LAB — GLUCOSE, CAPILLARY: Glucose-Capillary: 84 mg/dL (ref 70–99)

## 2011-03-31 LAB — CBC
Hemoglobin: 13.6
MCHC: 34.6
MCV: 91.1
RBC: 4.31

## 2011-10-23 ENCOUNTER — Encounter (INDEPENDENT_AMBULATORY_CARE_PROVIDER_SITE_OTHER): Payer: Self-pay | Admitting: Surgery

## 2011-10-28 ENCOUNTER — Other Ambulatory Visit (INDEPENDENT_AMBULATORY_CARE_PROVIDER_SITE_OTHER): Payer: Self-pay | Admitting: Surgery

## 2011-10-28 ENCOUNTER — Encounter (INDEPENDENT_AMBULATORY_CARE_PROVIDER_SITE_OTHER): Payer: Self-pay | Admitting: Surgery

## 2011-10-28 ENCOUNTER — Ambulatory Visit (INDEPENDENT_AMBULATORY_CARE_PROVIDER_SITE_OTHER): Payer: BC Managed Care – PPO | Admitting: Surgery

## 2011-10-28 VITALS — BP 110/80 | HR 98 | Temp 97.8°F | Resp 16 | Ht 66.5 in | Wt 209.2 lb

## 2011-10-28 DIAGNOSIS — R109 Unspecified abdominal pain: Secondary | ICD-10-CM

## 2011-10-28 DIAGNOSIS — K469 Unspecified abdominal hernia without obstruction or gangrene: Secondary | ICD-10-CM

## 2011-10-28 DIAGNOSIS — R1030 Lower abdominal pain, unspecified: Secondary | ICD-10-CM

## 2011-10-28 NOTE — Progress Notes (Signed)
Patient ID: Shannon Garner, female   DOB: January 04, 1951, 61 y.o.   MRN: [id number]  Chief Complaint  Patient presents with  . New Evaluation    Pos Umb Hernia    HPI Shannon Garner is a 61 y.o. female.  Referred by Mady Gemma, PA-C for Dr. Benedetto Goad for evaluation of possible umbilical hernia HPI 61 yo female presents with recent burning and tenderness in her midline abdominal wall below her umbilicus.  About a year ago, she had some pain at her umbilicus and some bloody drainage from her umbilicus for a couple of days.  This has resolved, but recently, she has noticed increasing pain and a burning sensation below her umbilicus.  She is now referred for surgical evaluation. Past Medical History  Diagnosis Date  . Allergy   . Anxiety   . Arthritis   . Bronchitis, not specified as acute or chronic   . Migraine without aura, without mention of intractable migraine without mention of status migrainosus   . Depression   . Diabetes mellitus   . Hyperlipidemia   . Hypertension   . Pain in joint, lower leg   . Pain in joint, shoulder region   . Lumbago   . Proteinuria   . Cervicalgia   . Osteoarthrosis, unspecified whether generalized or localized, unspecified site   . Acquired cyst of kidney   . Cellulitis and abscess of buttock     Past Surgical History  Procedure Date  . Spine surgery Unknown    cysts on spine  . Corrective rectal surgery   . Tubal ligation   . Abdominal hysterectomy   . Total knee arthroplasty     Right knee  . Kidney surgery     cysts on right kidney  . Cystectomy     On vertebrae. Rods put in as well    Family History  Problem Relation Age of Onset  . Heart disease Mother     heart attack  . Cancer Father     colon and liver    Social History History  Substance Use Topics  . Smoking status: Current Everyday Smoker -- 1.0 packs/day  . Smokeless tobacco: Not on file  . Alcohol Use: Yes     socially    Allergies  Allergen Reactions  . Keflex  (Cephalexin)     Current Outpatient Prescriptions  Medication Sig Dispense Refill  . albuterol-ipratropium (COMBIVENT) 18-103 MCG/ACT inhaler Inhale 2 puffs into the lungs every 6 (six) hours as needed.      Marland Kitchen aspirin 81 MG tablet Take 81 mg by mouth daily.      Marland Kitchen atorvastatin (LIPITOR) 40 MG tablet Take 40 mg by mouth daily.      . cyclobenzaprine (FLEXERIL) 10 MG tablet Take 10 mg by mouth 3 (three) times daily as needed.      Marland Kitchen FLUoxetine (PROZAC) 20 MG capsule Take 20 mg by mouth daily.      . hydrochlorothiazide (HYDRODIURIL) 25 MG tablet Take 25 mg by mouth daily.      Marland Kitchen HYDROcodone-acetaminophen (NORCO) 5-325 MG per tablet Take 1 tablet by mouth every 6 (six) hours as needed.      . hyoscyamine (LEVSINEX) 0.375 MG 12 hr capsule Take 0.375 mg by mouth 2 (two) times daily as needed.      Marland Kitchen lisinopril (PRINIVIL,ZESTRIL) 20 MG tablet Take 20 mg by mouth daily.      . meloxicam (MOBIC) 15 MG tablet Take 15 mg by mouth daily.      Marland Kitchen  metFORMIN (GLUCOPHAGE-XR) 750 MG 24 hr tablet Take 750 mg by mouth daily with breakfast.      . metoprolol succinate (TOPROL-XL) 50 MG 24 hr tablet Take 50 mg by mouth daily. Take with or immediately following a meal.      . promethazine (PHENERGAN) 25 MG tablet Take 25 mg by mouth every 6 (six) hours as needed.      . temazepam (RESTORIL) 15 MG capsule Take 15 mg by mouth at bedtime as needed.      . chlorpheniramine-HYDROcodone (TUSSIONEX) 10-8 MG/5ML LQCR       . Lite Touch Lancets MISC       . ONE TOUCH ULTRA TEST test strip         Review of Systems Review of Systems  Constitutional: Negative for fever, chills and unexpected weight change.  HENT: Negative for hearing loss, congestion, sore throat, trouble swallowing and voice change.   Eyes: Negative for visual disturbance.  Respiratory: Negative for cough and wheezing.   Cardiovascular: Negative for chest pain, palpitations and leg swelling.  Gastrointestinal: Positive for abdominal pain. Negative  for nausea, vomiting, diarrhea, constipation, blood in stool, abdominal distention and anal bleeding.  Genitourinary: Negative for hematuria, vaginal bleeding and difficulty urinating.  Musculoskeletal: Positive for myalgias and arthralgias.  Skin: Negative for rash and wound.  Neurological: Negative for seizures, syncope and headaches.  Hematological: Negative for adenopathy. Does not bruise/bleed easily.  Psychiatric/Behavioral: Negative for confusion.    Blood pressure 110/80, pulse 98, temperature 97.8 F (36.6 C), temperature source Temporal, resp. rate 16, height 5' 6.5" (1.689 m), weight 209 lb 3.2 oz (94.892 kg).  Physical Exam Physical Exam Obese, WDWN in NAD HEENT:  EOMI, sclera anicteric Neck:  No masses, no thyromegaly Lungs:  CTA bilaterally; normal respiratory effort CV:  Regular rate and rhythm; no murmurs Abd:  +bowel sounds, obese; small umbilical hernia deep within umbilicus - spontaneously reducible, non-tender;   She is tender to palpation in the lower midline above her Pfannenstiel incision - no palpable masses when standing with Valsalva maneuver. Ext:  Well-perfused; no edema Skin:  Warm, dry; no sign of jaundice  Data Reviewed none  Assessment    Small umbilical hernia - but not in the area of tenderness Lower midline abdominal pain    Plan    CT scan of the abdomen/pelvis to rule out ventral hernia Will discuss with patient at a follow-up visit 2 weeks.        Mandell Pangborn K. 10/28/2011, 2:20 PM

## 2011-10-29 ENCOUNTER — Encounter (INDEPENDENT_AMBULATORY_CARE_PROVIDER_SITE_OTHER): Payer: Self-pay

## 2011-10-29 LAB — CREATININE, SERUM: Creat: 0.85 mg/dL (ref 0.50–1.10)

## 2011-10-31 ENCOUNTER — Ambulatory Visit
Admission: RE | Admit: 2011-10-31 | Discharge: 2011-10-31 | Disposition: A | Discharge status: Home / Self Care | Payer: BC Managed Care – PPO | Source: Ambulatory Visit | Attending: Surgery | Admitting: Surgery

## 2011-10-31 DIAGNOSIS — R1030 Lower abdominal pain, unspecified: Secondary | ICD-10-CM

## 2011-10-31 MED ORDER — IOHEXOL 300 MG/ML  SOLN
100.0000 mL | Freq: Once | INTRAMUSCULAR | Status: AC | PRN
  Administered 2011-10-31: 100 mL via INTRAVENOUS

## 2011-11-07 ENCOUNTER — Encounter (INDEPENDENT_AMBULATORY_CARE_PROVIDER_SITE_OTHER): Payer: Self-pay | Admitting: Surgery

## 2011-11-12 ENCOUNTER — Encounter (INDEPENDENT_AMBULATORY_CARE_PROVIDER_SITE_OTHER): Payer: Self-pay | Admitting: Surgery

## 2011-11-12 ENCOUNTER — Ambulatory Visit (INDEPENDENT_AMBULATORY_CARE_PROVIDER_SITE_OTHER): Payer: BC Managed Care – PPO | Admitting: Surgery

## 2011-11-12 VITALS — BP 126/80 | HR 74 | Temp 97.6°F | Resp 16 | Ht 66.0 in | Wt 212.6 lb

## 2011-11-12 DIAGNOSIS — K429 Umbilical hernia without obstruction or gangrene: Secondary | ICD-10-CM

## 2011-11-12 NOTE — Progress Notes (Signed)
Her CT scan showed only a small umbilical hernia with no other explanation for her pain.    *RADIOLOGY REPORT*  Clinical Data: Low midline abdominal pain. Evaluate for hernia.  CT ABDOMEN AND PELVIS WITH CONTRAST  Technique: Multidetector CT imaging of the abdomen and pelvis was  performed following the standard protocol during bolus  administration of intravenous contrast.  Contrast: 100mL OMNIPAQUE IOHEXOL 300 MG/ML SOLN  Comparison: 09/26/2008  Findings: The lung bases are clear. No pericardial or pleural  effusion.  No focal liver abnormality. Stone is identified within the  gallbladder measuring 5 mm. No intra or extrahepatic biliary  dilatation. The pancreas appears normal. Normal appearance of the  spleen.  Both adrenal glands are normal. Scarring noted within the upper  pole of the right kidney. 10 HU cyst arising from the inferior  pole of the right kidney measures 1.6 cm, image 42. Several small  hypodensities are noted within the left kidney. These measure up  to 9 mm, image 45. Unchanged from prior examination. The urinary  bladder is normal. The patient appears to be status post  hysterectomy.  No upper abdominal or pelvic adenopathy. The stomach and the small  bowel loops appear within normal limits. The appendix is  identified and appears normal. There is a normal appearance to the  colon.  There is a small periumbilical hernia which contains fat only.  This measures approximately 1.7 x 2.5 cm.  Review of the visualized bony structures is significant for  degenerative disc disease. A first-degree anterolisthesis of L4  on L5 is noted.  IMPRESSION:  1. Small periumbilical hernia contains fat only.  2. Bilateral renal hypodensities. Likely cysts.  Original Report Authenticated By: TAYLOR H. STROUD, M.D.  We will plan an umbilical hernia repair with mesh.  The history of some drainage from the umbilicus is slightly concerning for a urachal cyst remnant.  It is  possible that we might have to excise the entire umbilicus if there is a chronic draining sinus.  We might also choose not to use mesh if there is a risk of chronic infection.  The surgical procedure has been discussed with the patient.  Potential risks, benefits, alternative treatments, and expected outcomes have been explained.  All of the patient's questions at this time have been answered.  The likelihood of reaching the patient's treatment goal is good.  The patient understand the proposed surgical procedure and wishes to proceed.   Ahna Konkle K. Shawni Volkov, MD, [facility]  11/12/2011 1:56 PM  Old note    Patient ID: Shannon Garner, female   DOB: 11/15/1950, 61 y.o.   MRN: [id number]  Chief Complaint  Patient presents with  . New Evaluation    Pos Umb Hernia    HPI Shannon Garner is a 61 y.o. female.  Referred by Kristen Kaplan, PA-C for Dr. Fred Wilson for evaluation of possible umbilical hernia HPI 61 yo female presents with recent burning and tenderness in her midline abdominal wall below her umbilicus.  About a year ago, she had some pain at her umbilicus and some bloody drainage from her umbilicus for a couple of days.  This has resolved, but recently, she has noticed increasing pain and a burning sensation below her umbilicus.  She is now referred for surgical evaluation. Past Medical History  Diagnosis Date  . Allergy   . Anxiety   . Arthritis   . Bronchitis, not specified as acute or chronic   . Migraine without aura,   without mention of intractable migraine without mention of status migrainosus   . Depression   . Diabetes mellitus   . Hyperlipidemia   . Hypertension   . Pain in joint, lower leg   . Pain in joint, shoulder region   . Lumbago   . Proteinuria   . Cervicalgia   . Osteoarthrosis, unspecified whether generalized or localized, unspecified site   . Acquired cyst of kidney   . Cellulitis and abscess of buttock     Past Surgical History  Procedure  Date  . Spine surgery Unknown    cysts on spine  . Corrective rectal surgery   . Tubal ligation   . Abdominal hysterectomy   . Total knee arthroplasty     Right knee  . Kidney surgery     cysts on right kidney  . Cystectomy     On vertebrae. Rods put in as well    Family History  Problem Relation Age of Onset  . Heart disease Mother     heart attack  . Cancer Father     colon and liver    Social History History  Substance Use Topics  . Smoking status: Current Everyday Smoker -- 1.0 packs/day  . Smokeless tobacco: Not on file  . Alcohol Use: Yes     socially    Allergies  Allergen Reactions  . Keflex (Cephalexin)     Current Outpatient Prescriptions  Medication Sig Dispense Refill  . albuterol-ipratropium (COMBIVENT) 18-103 MCG/ACT inhaler Inhale 2 puffs into the lungs every 6 (six) hours as needed.      . aspirin 81 MG tablet Take 81 mg by mouth daily.      . atorvastatin (LIPITOR) 40 MG tablet Take 40 mg by mouth daily.      . cyclobenzaprine (FLEXERIL) 10 MG tablet Take 10 mg by mouth 3 (three) times daily as needed.      . FLUoxetine (PROZAC) 20 MG capsule Take 20 mg by mouth daily.      . hydrochlorothiazide (HYDRODIURIL) 25 MG tablet Take 25 mg by mouth daily.      . HYDROcodone-acetaminophen (NORCO) 5-325 MG per tablet Take 1 tablet by mouth every 6 (six) hours as needed.      . hyoscyamine (LEVSINEX) 0.375 MG 12 hr capsule Take 0.375 mg by mouth 2 (two) times daily as needed.      . lisinopril (PRINIVIL,ZESTRIL) 20 MG tablet Take 20 mg by mouth daily.      . meloxicam (MOBIC) 15 MG tablet Take 15 mg by mouth daily.      . metFORMIN (GLUCOPHAGE-XR) 750 MG 24 hr tablet Take 750 mg by mouth daily with breakfast.      . metoprolol succinate (TOPROL-XL) 50 MG 24 hr tablet Take 50 mg by mouth daily. Take with or immediately following a meal.      . promethazine (PHENERGAN) 25 MG tablet Take 25 mg by mouth every 6 (six) hours as needed.      . temazepam (RESTORIL)  15 MG capsule Take 15 mg by mouth at bedtime as needed.      . chlorpheniramine-HYDROcodone (TUSSIONEX) 10-8 MG/5ML LQCR       . Lite Touch Lancets MISC       . ONE TOUCH ULTRA TEST test strip         Review of Systems Review of Systems  Constitutional: Negative for fever, chills and unexpected weight change.  HENT: Negative for hearing loss, congestion, sore throat, trouble swallowing and   voice change.   Eyes: Negative for visual disturbance.  Respiratory: Negative for cough and wheezing.   Cardiovascular: Negative for chest pain, palpitations and leg swelling.  Gastrointestinal: Positive for abdominal pain. Negative for nausea, vomiting, diarrhea, constipation, blood in stool, abdominal distention and anal bleeding.  Genitourinary: Negative for hematuria, vaginal bleeding and difficulty urinating.  Musculoskeletal: Positive for myalgias and arthralgias.  Skin: Negative for rash and wound.  Neurological: Negative for seizures, syncope and headaches.  Hematological: Negative for adenopathy. Does not bruise/bleed easily.  Psychiatric/Behavioral: Negative for confusion.    Blood pressure 110/80, pulse 98, temperature 97.8 F (36.6 C), temperature source Temporal, resp. rate 16, height 5' 6.5" (1.689 m), weight 209 lb 3.2 oz (94.892 kg).  Physical Exam Physical Exam Obese, WDWN in NAD HEENT:  EOMI, sclera anicteric Neck:  No masses, no thyromegaly Lungs:  CTA bilaterally; normal respiratory effort CV:  Regular rate and rhythm; no murmurs Abd:  +bowel sounds, obese; small umbilical hernia deep within umbilicus - spontaneously reducible, non-tender;   She is tender to palpation in the lower midline above her Pfannenstiel incision - no palpable masses when standing with Valsalva maneuver. Ext:  Well-perfused; no edema Skin:  Warm, dry; no sign of jaundice  Data Reviewed none  Assessment    Small umbilical hernia - but not in the area of tenderness Lower midline abdominal pain      Plan    CT scan of the abdomen/pelvis to rule out ventral hernia Will discuss with patient at a follow-up visit 2 weeks.        Calleigh Lafontant K. 10/28/2011, 2:20 PM     

## 2011-11-12 NOTE — Patient Instructions (Signed)
Hold your aspirin 5 days prior to surgery. 

## 2011-11-27 ENCOUNTER — Encounter (HOSPITAL_BASED_OUTPATIENT_CLINIC_OR_DEPARTMENT_OTHER): Payer: Self-pay | Admitting: *Deleted

## 2011-11-27 NOTE — Progress Notes (Signed)
To come in for labs  

## 2011-11-28 ENCOUNTER — Encounter (HOSPITAL_BASED_OUTPATIENT_CLINIC_OR_DEPARTMENT_OTHER)
Admission: RE | Admit: 2011-11-28 | Discharge: 2011-11-28 | Disposition: A | Discharge status: Home / Self Care | Payer: BC Managed Care – PPO | Source: Ambulatory Visit | Attending: Surgery | Admitting: Surgery

## 2011-11-28 LAB — BASIC METABOLIC PANEL
Calcium: 10.1 mg/dL (ref 8.4–10.5)
GFR calc non Af Amer: >90 mL/min (ref >90)
Glucose, Bld: 157 mg/dL — ABNORMAL HIGH (ref 70–99)
Sodium: 139 mEq/L (ref 135–145)

## 2011-12-02 ENCOUNTER — Encounter (HOSPITAL_BASED_OUTPATIENT_CLINIC_OR_DEPARTMENT_OTHER): Payer: Self-pay | Admitting: Anesthesiology

## 2011-12-02 ENCOUNTER — Encounter (HOSPITAL_BASED_OUTPATIENT_CLINIC_OR_DEPARTMENT_OTHER)
Admission: RE | Disposition: A | Discharge status: Home / Self Care | Payer: Self-pay | Source: Ambulatory Visit | Attending: Surgery

## 2011-12-02 ENCOUNTER — Encounter (HOSPITAL_BASED_OUTPATIENT_CLINIC_OR_DEPARTMENT_OTHER): Payer: Self-pay | Admitting: *Deleted

## 2011-12-02 ENCOUNTER — Ambulatory Visit (HOSPITAL_BASED_OUTPATIENT_CLINIC_OR_DEPARTMENT_OTHER): Payer: BC Managed Care – PPO | Admitting: Anesthesiology

## 2011-12-02 ENCOUNTER — Ambulatory Visit (HOSPITAL_BASED_OUTPATIENT_CLINIC_OR_DEPARTMENT_OTHER)
Admission: RE | Admit: 2011-12-02 | Discharge: 2011-12-02 | Disposition: A | Discharge status: Home / Self Care | Payer: BC Managed Care – PPO | Source: Ambulatory Visit | Attending: Surgery | Admitting: Surgery

## 2011-12-02 DIAGNOSIS — K429 Umbilical hernia without obstruction or gangrene: Secondary | ICD-10-CM | POA: Insufficient documentation

## 2011-12-02 DIAGNOSIS — R51 Headache: Secondary | ICD-10-CM | POA: Insufficient documentation

## 2011-12-02 DIAGNOSIS — F411 Generalized anxiety disorder: Secondary | ICD-10-CM | POA: Insufficient documentation

## 2011-12-02 DIAGNOSIS — F17200 Nicotine dependence, unspecified, uncomplicated: Secondary | ICD-10-CM | POA: Insufficient documentation

## 2011-12-02 DIAGNOSIS — Z01810 Encounter for preprocedural cardiovascular examination: Secondary | ICD-10-CM | POA: Insufficient documentation

## 2011-12-02 DIAGNOSIS — I10 Essential (primary) hypertension: Secondary | ICD-10-CM | POA: Insufficient documentation

## 2011-12-02 DIAGNOSIS — F3289 Other specified depressive episodes: Secondary | ICD-10-CM | POA: Insufficient documentation

## 2011-12-02 DIAGNOSIS — M129 Arthropathy, unspecified: Secondary | ICD-10-CM | POA: Insufficient documentation

## 2011-12-02 DIAGNOSIS — F329 Major depressive disorder, single episode, unspecified: Secondary | ICD-10-CM | POA: Insufficient documentation

## 2011-12-02 DIAGNOSIS — E119 Type 2 diabetes mellitus without complications: Secondary | ICD-10-CM | POA: Insufficient documentation

## 2011-12-02 HISTORY — PX: HERNIA REPAIR: SHX51

## 2011-12-02 HISTORY — PX: UMBILICAL HERNIA REPAIR: SHX196

## 2011-12-02 LAB — GLUCOSE, CAPILLARY: Glucose-Capillary: 134 mg/dL — ABNORMAL HIGH (ref 70–99)

## 2011-12-02 SURGERY — REPAIR, HERNIA, UMBILICAL, ADULT
Anesthesia: General | Site: Abdomen | Wound class: Clean

## 2011-12-02 MED ORDER — ONDANSETRON HCL 4 MG/2ML IJ SOLN
INTRAMUSCULAR | Status: DC | PRN
  Administered 2011-12-02: 4 mg via INTRAVENOUS

## 2011-12-02 MED ORDER — LACTATED RINGERS IV SOLN
INTRAVENOUS | Status: DC
  Administered 2011-12-02 (×2): via INTRAVENOUS

## 2011-12-02 MED ORDER — DEXAMETHASONE SODIUM PHOSPHATE 4 MG/ML IJ SOLN
INTRAMUSCULAR | Status: DC | PRN
  Administered 2011-12-02: 4 mg via INTRAVENOUS

## 2011-12-02 MED ORDER — FENTANYL CITRATE 0.05 MG/ML IJ SOLN
INTRAMUSCULAR | Status: DC | PRN
  Administered 2011-12-02: 50 ug via INTRAVENOUS
  Administered 2011-12-02: 25 ug via INTRAVENOUS

## 2011-12-02 MED ORDER — PROPOFOL 10 MG/ML IV EMUL
INTRAVENOUS | Status: DC | PRN
  Administered 2011-12-02: 180 mg via INTRAVENOUS

## 2011-12-02 MED ORDER — MORPHINE SULFATE 2 MG/ML IJ SOLN: 2.0000 mg | INTRAMUSCULAR | Status: DC | PRN

## 2011-12-02 MED ORDER — EPHEDRINE SULFATE 50 MG/ML IJ SOLN
INTRAMUSCULAR | Status: DC | PRN
  Administered 2011-12-02: 10 mg via INTRAVENOUS

## 2011-12-02 MED ORDER — ONDANSETRON HCL 4 MG/2ML IJ SOLN: 4.0000 mg | INTRAMUSCULAR | Status: DC | PRN

## 2011-12-02 MED ORDER — MIDAZOLAM HCL 5 MG/5ML IJ SOLN
INTRAMUSCULAR | Status: DC | PRN
  Administered 2011-12-02: 2 mg via INTRAVENOUS

## 2011-12-02 MED ORDER — ONDANSETRON HCL 4 MG/2ML IJ SOLN: 4.0000 mg | Freq: Four times a day (QID) | INTRAMUSCULAR | Status: DC | PRN

## 2011-12-02 MED ORDER — OXYCODONE-ACETAMINOPHEN 5-325 MG PO TABS: 1.0000 | ORAL_TABLET | ORAL | Status: AC | PRN

## 2011-12-02 MED ORDER — LIDOCAINE HCL (CARDIAC) 20 MG/ML IV SOLN
INTRAVENOUS | Status: DC | PRN
  Administered 2011-12-02: 50 mg via INTRAVENOUS

## 2011-12-02 MED ORDER — BUPIVACAINE-EPINEPHRINE 0.25% -1:200000 IJ SOLN
INTRAMUSCULAR | Status: DC | PRN
  Administered 2011-12-02: 10 mL

## 2011-12-02 MED ORDER — OXYCODONE-ACETAMINOPHEN 5-325 MG PO TABS: 1.0000 | ORAL_TABLET | ORAL | Status: DC | PRN

## 2011-12-02 MED ORDER — VANCOMYCIN HCL IN DEXTROSE 1-5 GM/200ML-% IV SOLN
1000.0000 mg | INTRAVENOUS | Status: AC
  Administered 2011-12-02: 1000 mg via INTRAVENOUS

## 2011-12-02 MED ORDER — HYDROMORPHONE HCL PF 1 MG/ML IJ SOLN
0.2500 mg | INTRAMUSCULAR | Status: DC | PRN
  Administered 2011-12-02: 0.5 mg via INTRAVENOUS

## 2011-12-02 SURGICAL SUPPLY — 61 items
APL SKNCLS STERI-STRIP NONHPOA (GAUZE/BANDAGES/DRESSINGS) ×1
APPLICATOR COTTON TIP 6IN STRL (MISCELLANEOUS) ×1 IMPLANT
BENZOIN TINCTURE PRP APPL 2/3 (GAUZE/BANDAGES/DRESSINGS) ×1 IMPLANT
BLADE HEX COATED 2.75 (ELECTRODE) ×2 IMPLANT
BLADE SURG 15 STRL LF DISP TIS (BLADE) ×1 IMPLANT
BLADE SURG 15 STRL SS (BLADE) ×2
BLADE SURG ROTATE 9660 (MISCELLANEOUS) ×1 IMPLANT
CANISTER SUCTION 1200CC (MISCELLANEOUS) IMPLANT
CHLORAPREP W/TINT 26ML (MISCELLANEOUS) ×2 IMPLANT
CLOTH BEACON ORANGE TIMEOUT ST (SAFETY) ×2 IMPLANT
COVER MAYO STAND STRL (DRAPES) ×2 IMPLANT
COVER TABLE BACK 60X90 (DRAPES) ×2 IMPLANT
DECANTER SPIKE VIAL GLASS SM (MISCELLANEOUS) ×1 IMPLANT
DRAPE LAPAROTOMY T 102X78X121 (DRAPES) ×2 IMPLANT
DRAPE UTILITY XL STRL (DRAPES) ×2 IMPLANT
DRSG TEGADERM 4X4.75 (GAUZE/BANDAGES/DRESSINGS) ×2 IMPLANT
ELECT REM PT RETURN 9FT ADLT (ELECTROSURGICAL) ×2
ELECTRODE REM PT RTRN 9FT ADLT (ELECTROSURGICAL) ×1 IMPLANT
GAUZE SPONGE 4X4 12PLY STRL LF (GAUZE/BANDAGES/DRESSINGS) ×4 IMPLANT
GLOVE BIO SURGEON STRL SZ7 (GLOVE) ×2 IMPLANT
GLOVE BIOGEL PI IND STRL 7.0 (GLOVE) IMPLANT
GLOVE BIOGEL PI IND STRL 7.5 (GLOVE) ×1 IMPLANT
GLOVE BIOGEL PI INDICATOR 7.0 (GLOVE) ×1
GLOVE BIOGEL PI INDICATOR 7.5 (GLOVE) ×2
GLOVE ECLIPSE 6.5 STRL STRAW (GLOVE) ×1 IMPLANT
GLOVE ECLIPSE 7.0 STRL STRAW (GLOVE) ×1 IMPLANT
GOWN PREVENTION PLUS XLARGE (GOWN DISPOSABLE) ×5 IMPLANT
GOWN PREVENTION PLUS XXLARGE (GOWN DISPOSABLE) ×1 IMPLANT
NDL HYPO 25X1 1.5 SAFETY (NEEDLE) IMPLANT
NDL SAFETY ECLIPSE 18X1.5 (NEEDLE) IMPLANT
NEEDLE HYPO 18GX1.5 SHARP (NEEDLE)
NEEDLE HYPO 25X1 1.5 SAFETY (NEEDLE) ×2 IMPLANT
NS IRRIG 1000ML POUR BTL (IV SOLUTION) ×1 IMPLANT
PACK BASIN DAY SURGERY FS (CUSTOM PROCEDURE TRAY) ×2 IMPLANT
PATCH VENTRAL SMALL 4.3 (Mesh Specialty) ×1 IMPLANT
PENCIL BUTTON HOLSTER BLD 10FT (ELECTRODE) ×2 IMPLANT
SLEEVE SCD COMPRESS KNEE MED (MISCELLANEOUS) ×1 IMPLANT
STAPLER VISISTAT 35W (STAPLE) IMPLANT
STRIP CLOSURE SKIN 1/2X4 (GAUZE/BANDAGES/DRESSINGS) ×1 IMPLANT
SUT MNCRL AB 4-0 PS2 18 (SUTURE) ×2 IMPLANT
SUT NOVA NAB DX-16 0-1 5-0 T12 (SUTURE) ×1 IMPLANT
SUT NOVA NAB GS-21 0 18 T12 DT (SUTURE) ×1 IMPLANT
SUT PDS AB 0 CT 36 (SUTURE) IMPLANT
SUT PROLENE 0 CT 1 30 (SUTURE) IMPLANT
SUT PROLENE 0 CT 1 CR/8 (SUTURE) IMPLANT
SUT SILK 3 0 TIES 17X18 (SUTURE)
SUT SILK 3-0 18XBRD TIE BLK (SUTURE) IMPLANT
SUT VIC AB 2-0 CT1 27 (SUTURE)
SUT VIC AB 2-0 CT1 TAPERPNT 27 (SUTURE) IMPLANT
SUT VIC AB 3-0 SH 27 (SUTURE) ×2
SUT VIC AB 3-0 SH 27X BRD (SUTURE) ×1 IMPLANT
SUT VIC AB 4-0 BRD 54 (SUTURE) IMPLANT
SUT VIC AB 4-0 SH 18 (SUTURE) IMPLANT
SUT VICRYL 4-0 PS2 18IN ABS (SUTURE) IMPLANT
SYR BULB 3OZ (MISCELLANEOUS) IMPLANT
SYR CONTROL 10ML LL (SYRINGE) ×1 IMPLANT
TOWEL OR 17X24 6PK STRL BLUE (TOWEL DISPOSABLE) ×4 IMPLANT
TOWEL OR NON WOVEN STRL DISP B (DISPOSABLE) ×2 IMPLANT
TUBE CONNECTING 20X1/4 (TUBING) IMPLANT
WATER STERILE IRR 1000ML POUR (IV SOLUTION) ×1 IMPLANT
YANKAUER SUCT BULB TIP NO VENT (SUCTIONS) IMPLANT

## 2011-12-02 NOTE — Interval H&P Note (Signed)
History and Physical Interval Note:  12/02/2011 11:15 AM  Shannon Garner  has presented today for surgery, with the diagnosis of Umbilical hernia with mesh  The various methods of treatment have been discussed with the patient and family. After consideration of risks, benefits and other options for treatment, the patient has consented to  Procedure(s) (LRB): HERNIA REPAIR UMBILICAL ADULT (N/A) INSERTION OF MESH (N/A) as a surgical intervention .  The patient's history has been reviewed, patient examined, no change in status, stable for surgery.  I have reviewed the patients' chart and labs.  Questions were answered to the patient's satisfaction.     Camary Sosa K.

## 2011-12-02 NOTE — Anesthesia Preprocedure Evaluation (Signed)
Anesthesia Evaluation  Patient identified by MRN, date of birth, ID band Patient awake    Reviewed: Allergy & Precautions, H&P , NPO status , Patient's Chart, lab work & pertinent test results  Airway Mallampati: II  Neck ROM: full    Dental   Pulmonary Current Smoker,          Cardiovascular hypertension,     Neuro/Psych  Headaches, Anxiety Depression    GI/Hepatic   Endo/Other  Diabetes mellitus-, Type 2obese  Renal/GU      Musculoskeletal  (+) Arthritis -,   Abdominal   Peds  Hematology   Anesthesia Other Findings   Reproductive/Obstetrics                           Anesthesia Physical Anesthesia Plan  ASA: II  Anesthesia Plan: General   Post-op Pain Management:    Induction: Intravenous  Airway Management Planned: Oral ETT  Additional Equipment:   Intra-op Plan:   Post-operative Plan: Extubation in OR  Informed Consent: I have reviewed the patients History and Physical, chart, labs and discussed the procedure including the risks, benefits and alternatives for the proposed anesthesia with the patient or authorized representative who has indicated his/her understanding and acceptance.     Plan Discussed with: CRNA and Surgeon  Anesthesia Plan Comments:         Anesthesia Quick Evaluation

## 2011-12-02 NOTE — Transfer of Care (Signed)
Immediate Anesthesia Transfer of Care Note  Patient: Shannon Garner  Procedure(s) Performed: Procedure(s) (LRB): HERNIA REPAIR UMBILICAL ADULT (N/A) INSERTION OF MESH (N/A)  Patient Location: PACU  Anesthesia Type: General  Level of Consciousness: awake and alert   Airway & Oxygen Therapy: Patient Spontanous Breathing and Patient connected to face mask oxygen  Post-op Assessment: Report given to PACU RN and Post -op Vital signs reviewed and stable  Post vital signs: Reviewed and stable  Complications: No apparent anesthesia complications

## 2011-12-02 NOTE — H&P (View-Only) (Signed)
Her CT scan showed only a small umbilical hernia with no other explanation for her pain.    *RADIOLOGY REPORT*  Clinical Data: Low midline abdominal pain. Evaluate for hernia.  CT ABDOMEN AND PELVIS WITH CONTRAST  Technique: Multidetector CT imaging of the abdomen and pelvis was  performed following the standard protocol during bolus  administration of intravenous contrast.  Contrast: OMNIPAQUE IOHEXOL 300 MG/ML SOLN  Comparison: 09/26/2008  Findings: The lung bases are clear. No pericardial or pleural  effusion.  No focal liver abnormality. Stone is identified within the  gallbladder measuring 5 mm. No intra or extrahepatic biliary  dilatation. The pancreas appears normal. Normal appearance of the  spleen.  Both adrenal glands are normal. Scarring noted within the upper  pole of the right kidney. 10 HU cyst arising from the inferior  pole of the right kidney measures 1.6 cm, image 42. Several small  hypodensities are noted within the left kidney. These measure up  to 9 mm, image 45. Unchanged from prior examination. The urinary  bladder is normal. The patient appears to be status post  hysterectomy.  No upper abdominal or pelvic adenopathy. The stomach and the small  bowel loops appear within normal limits. The appendix is  identified and appears normal. There is a normal appearance to the  colon.  There is a small periumbilical hernia which contains fat only.  This measures approximately 1.7 x 2.5 cm.  Review of the visualized bony structures is significant for  degenerative disc disease. A first-degree anterolisthesis of L4  on L5 is noted.  IMPRESSION:  1. Small periumbilical hernia contains fat only.  2. Bilateral renal hypodensities. Likely cysts.  Original Report Authenticated By: Rosealee Albee, M.D.  We will plan an umbilical hernia repair with mesh.  The history of some drainage from the umbilicus is slightly concerning for a urachal cyst remnant.  It is  possible that we might have to excise the entire umbilicus if there is a chronic draining sinus.  We might also choose not to use mesh if there is a risk of chronic infection.  The surgical procedure has been discussed with the patient.  Potential risks, benefits, alternative treatments, and expected outcomes have been explained.  All of the patient's questions at this time have been answered.  The likelihood of reaching the patient's treatment goal is good.  The patient understand the proposed surgical procedure and wishes to proceed.   Wilmon Arms. Corliss Skains, MD, [facility] Surgery  11/12/2011 1:56 PM  Old note    Patient ID: Shannon Garner, female   DOB: [birth year], 61 y.o.   MRN: [id number]  Chief Complaint  Patient presents with  . New Evaluation    Pos Umb Hernia    HPI Shannon Garner is a 61 y.o. female.  Referred by Mady Gemma, PA-C for Dr. Benedetto Goad for evaluation of possible umbilical hernia HPI 61 yo female presents with recent burning and tenderness in her midline abdominal wall below her umbilicus.  About a year ago, she had some pain at her umbilicus and some bloody drainage from her umbilicus for a couple of days.  This has resolved, but recently, she has noticed increasing pain and a burning sensation below her umbilicus.  She is now referred for surgical evaluation. Past Medical History  Diagnosis Date  . Allergy   . Anxiety   . Arthritis   . Bronchitis, not specified as acute or chronic   . Migraine without aura,  without mention of intractable migraine without mention of status migrainosus   . Depression   . Diabetes mellitus   . Hyperlipidemia   . Hypertension   . Pain in joint, lower leg   . Pain in joint, shoulder region   . Lumbago   . Proteinuria   . Cervicalgia   . Osteoarthrosis, unspecified whether generalized or localized, unspecified site   . Acquired cyst of kidney   . Cellulitis and abscess of buttock     Past Surgical History  Procedure  Date  . Spine surgery Unknown    cysts on spine  . Corrective rectal surgery   . Tubal ligation   . Abdominal hysterectomy   . Total knee arthroplasty     Right knee  . Kidney surgery     cysts on right kidney  . Cystectomy     On vertebrae. Rods put in as well    Family History  Problem Relation Age of Onset  . Heart disease Mother     heart attack  . Cancer Father     colon and liver    Social History History  Substance Use Topics  . Smoking status: Current Everyday Smoker -- 1.0 packs/day  . Smokeless tobacco: Not on file  . Alcohol Use: Yes     socially    Allergies  Allergen Reactions  . Keflex (Cephalexin)     Current Outpatient Prescriptions  Medication Sig Dispense Refill  . albuterol-ipratropium (COMBIVENT) 18-103 MCG/ACT inhaler Inhale 2 puffs into the lungs every 6 (six) hours as needed.      Marland Kitchen aspirin 81 MG tablet Take 81 mg by mouth daily.      Marland Kitchen atorvastatin (LIPITOR) 40 MG tablet Take 40 mg by mouth daily.      . cyclobenzaprine (FLEXERIL) 10 MG tablet Take 10 mg by mouth 3 (three) times daily as needed.      Marland Kitchen FLUoxetine (PROZAC) 20 MG capsule Take 20 mg by mouth daily.      . hydrochlorothiazide (HYDRODIURIL) 25 MG tablet Take 25 mg by mouth daily.      Marland Kitchen HYDROcodone-acetaminophen (NORCO) 5-325 MG per tablet Take 1 tablet by mouth every 6 (six) hours as needed.      . hyoscyamine (LEVSINEX) 0.375 MG 12 hr capsule Take 0.375 mg by mouth 2 (two) times daily as needed.      Marland Kitchen lisinopril (PRINIVIL,ZESTRIL) 20 MG tablet Take 20 mg by mouth daily.      . meloxicam (MOBIC) 15 MG tablet Take 15 mg by mouth daily.      . metFORMIN (GLUCOPHAGE-XR) 750 MG 24 hr tablet Take 750 mg by mouth daily with breakfast.      . metoprolol succinate (TOPROL-XL) 50 MG 24 hr tablet Take 50 mg by mouth daily. Take with or immediately following a meal.      . promethazine (PHENERGAN) 25 MG tablet Take 25 mg by mouth every 6 (six) hours as needed.      . temazepam (RESTORIL)  15 MG capsule Take 15 mg by mouth at bedtime as needed.      . chlorpheniramine-HYDROcodone (TUSSIONEX) 10-8 MG/5ML LQCR       . Lite Touch Lancets MISC       . ONE TOUCH ULTRA TEST test strip         Review of Systems Review of Systems  Constitutional: Negative for fever, chills and unexpected weight change.  HENT: Negative for hearing loss, congestion, sore throat, trouble swallowing and  voice change.   Eyes: Negative for visual disturbance.  Respiratory: Negative for cough and wheezing.   Cardiovascular: Negative for chest pain, palpitations and leg swelling.  Gastrointestinal: Positive for abdominal pain. Negative for nausea, vomiting, diarrhea, constipation, blood in stool, abdominal distention and anal bleeding.  Genitourinary: Negative for hematuria, vaginal bleeding and difficulty urinating.  Musculoskeletal: Positive for myalgias and arthralgias.  Skin: Negative for rash and wound.  Neurological: Negative for seizures, syncope and headaches.  Hematological: Negative for adenopathy. Does not bruise/bleed easily.  Psychiatric/Behavioral: Negative for confusion.    Blood pressure 110/80, pulse 98, temperature 97.8 F (36.6 C), temperature source Temporal, resp. rate 16, height 5' 6.5" (1.689 m), weight 209 lb 3.2 oz (94.892 kg).  Physical Exam Physical Exam Obese, WDWN in NAD HEENT:  EOMI, sclera anicteric Neck:  No masses, no thyromegaly Lungs:  CTA bilaterally; normal respiratory effort CV:  Regular rate and rhythm; no murmurs Abd:  +bowel sounds, obese; small umbilical hernia deep within umbilicus - spontaneously reducible, non-tender;   She is tender to palpation in the lower midline above her Pfannenstiel incision - no palpable masses when standing with Valsalva maneuver. Ext:  Well-perfused; no edema Skin:  Warm, dry; no sign of jaundice  Data Reviewed none  Assessment    Small umbilical hernia - but not in the area of tenderness Lower midline abdominal pain      Plan    CT scan of the abdomen/pelvis to rule out ventral hernia Will discuss with patient at a follow-up visit 2 weeks.        Favio Moder K. 10/28/2011, 2:20 PM

## 2011-12-02 NOTE — Anesthesia Postprocedure Evaluation (Signed)
Anesthesia Post Note  Patient: Shannon Garner  Procedure(s) Performed: Procedure(s) (LRB): HERNIA REPAIR UMBILICAL ADULT (N/A) INSERTION OF MESH (N/A)  Anesthesia type: General  Patient location: PACU  Post pain: Pain level controlled and Adequate analgesia  Post assessment: Post-op Vital signs reviewed, Patient's Cardiovascular Status Stable, Respiratory Function Stable, Patent Airway and Pain level controlled  Last Vitals:  Filed Vitals:   12/02/11 1330  BP: 161/69  Pulse: 70  Temp:   Resp: 12    Post vital signs: Reviewed and stable  Level of consciousness: awake, alert  and oriented  Complications: No apparent anesthesia complications

## 2011-12-02 NOTE — Anesthesia Procedure Notes (Signed)
Procedure Name: LMA Insertion Date/Time: 12/02/2011 11:44 AM Performed by: Caren Macadam Pre-anesthesia Checklist: Patient identified, Emergency Drugs available, Suction available and Patient being monitored Patient Re-evaluated:Patient Re-evaluated prior to inductionOxygen Delivery Method: Circle System Utilized Preoxygenation: Pre-oxygenation with 100% oxygen Intubation Type: IV induction Ventilation: Mask ventilation without difficulty LMA: LMA with gastric port inserted LMA Size: 4.0 Number of attempts: 1 Airway Equipment and Method: bite block Placement Confirmation: positive ETCO2 and breath sounds checked- equal and bilateral Tube secured with: Tape Dental Injury: Teeth and Oropharynx as per pre-operative assessment

## 2011-12-02 NOTE — Op Note (Signed)
Indications:  The patient presented with a history of a reducible umbilical hernia.  The patient was examined and we recommended umbilical hernia repair with mesh.  Pre-operative diagnosis:  Umbilical hernia  Post-operative diagnosis:  Same  Surgeon: AmeLie Hollars K.   Assistants: none  Anesthesia: General LMA anesthesia  ASA Class: 2   Procedure Details  The patient was seen again in the Holding Room. The risks, benefits, complications, treatment options, and expected outcomes were discussed with the patient. The possibilities of reaction to medication, pulmonary aspiration, perforation of viscus, bleeding, recurrent infection, the need for additional procedures, and development of a complication requiring transfusion or further operation were discussed with the patient and/or family. There was concurrence with the proposed plan, and informed consent was obtained. The site of surgery was properly noted/marked. The patient was taken to the Operating Room, identified as Shannon Garner, and the procedure verified as umbilical hernia repair. A Time Out was held and the above information confirmed.  After an adequate level of general anesthesia was obtained, the patient's abdomen was prepped with Chloraprep and draped in sterile fashion.   She has several scratches on her legs and lower abdomen due to scratches from her pet squirrel.  None of these were in our operative field.  We made a transverse incision below the umbilicus.  Dissection was carried down to the hernia sac with cautery.  The patient has a very large amount of adipose tissue.  We explored her umbilicus and she seems to have a tiny pinhole opening in the bottom of her umbilicus.  She has a history of some drainage from her umbilicus.  I made the decision to excise her umbilicus completely.  We extended the incision to an elliptical incision around the umbilicus. We dissected bluntly around the hernia sac down to the edge of the fascial  defect.  The hernia sac was opened and the omentum was reduced back into the abdomen.  The fascial defect measured 2 cm..  We cleared the fascia in all directions.  A small Proceed ventral patch was inserted into the pre-peritoneal space and was deployed.  The mesh was secured with four trans-fascial sutures of 0 Novofil.  The fascial defect was closed with multiple interrupted figure-of-eight 1 Novofil sutures.  We recreated a new umbilicus using 3-0 Vicryl suture.  3-0 Vicryl was used to close the subcutaneous tissues and 4-0 Monocryl was used to close the skin.  Steri-strips and clean dressing were applied.  The patient was extubated and brought to the recovery room in stable condition.  All sponge, instrument, and needle counts were correct prior to closure and at the conclusion of the case.   Estimated Blood Loss: Minimal            Complications: None; patient tolerated the procedure well.         Disposition: PACU - hemodynamically stable.         Condition: stable  Wilmon Arms. Corliss Skains, MD, [facility] Surgery  12/02/2011 12:55 PM

## 2011-12-02 NOTE — Discharge Instructions (Signed)
[facility], [place]  UMBILICAL HERNIA REPAIR: POST OP INSTRUCTIONS  Always review your discharge instruction sheet given to you by the facility where your surgery was performed. IF YOU HAVE DISABILITY OR FAMILY LEAVE FORMS, YOU MUST BRING THEM TO THE OFFICE FOR PROCESSING.   DO NOT GIVE THEM TO YOUR DOCTOR.  1. A  prescription for pain medication may be given to you upon discharge.  Take your pain medication as prescribed, if needed.  If narcotic pain medicine is not needed, then you may take acetaminophen (Tylenol) or ibuprofen (Advil) as needed. 2. Take your usually prescribed medications unless otherwise directed. 3. If you need a refill on your pain medication, please contact your pharmacy.  They will contact our office to request authorization. Prescriptions will not be filled after 5 pm or on week-ends. 4. You should follow a light diet the first 24 hours after arrival home, such as soup and crackers, etc.  Be sure to include lots of fluids daily.  Resume your normal diet the day after surgery. 5. Most patients will experience some swelling and bruising around the umbilicus or in the groin and scrotum.  Ice packs and reclining will help.  Swelling and bruising can take several days to resolve.  6. It is common to experience some constipation if taking pain medication after surgery.  Increasing fluid intake and taking a stool softener (such as Colace) will usually help or prevent this problem from occurring.  A mild laxative (Milk of Magnesia or Miralax) should be taken according to package directions if there are no bowel movements after 48 hours. 7. Unless discharge instructions indicate otherwise, you may remove your bandages 24-48 hours after surgery, and you may shower at that time.  You will have steri-strips (small skin tapes) in place directly over the incision.  These strips should be left on the skin for 7-10 days. 8. ACTIVITIES:  You may resume regular (light) daily activities  beginning the next day--such as daily self-care, walking, climbing stairs--gradually increasing activities as tolerated.  You may have sexual intercourse when it is comfortable.  Refrain from any heavy lifting or straining until approved by your doctor. a. You may drive when you are no longer taking prescription pain medication, you can comfortably wear a seatbelt, and you can safely maneuver your car and apply brakes. b. RETURN TO WORK:  2-3 weeks with light duty - no lifting over 15 lbs. 9. You should see your doctor in the office for a follow-up appointment approximately 2-3 weeks after your surgery.  Make sure that you call for this appointment within a day or two after you arrive home to insure a convenient appointment time. 10. OTHER INSTRUCTIONS:  __________________________________________________________________________________________________________________________________________________________________________________________   WHEN TO CALL YOUR DOCTOR: 1. Fever over 101.0 2. Inability to urinate 3. Nausea and/or vomiting 4. Extreme swelling or bruising 5. Continued bleeding from incision. 6. Increased pain, redness, or drainage from the incision  The clinic staff is available to answer your questions during regular business hours.  Please don't hesitate to call and ask to speak to one of the nurses for clinical concerns.  If you have a medical emergency, go to the nearest emergency room or call 911.  A surgeon from [facility] is always on call at the hospital   [address], [address], [place], [place]  [address] ?  [address] [address], [place], [place]   [address] [phone]    FAX [phone] Web site:  [email or web address]   Post Anesthesia Home Care Instructions  Activity: Get plenty of rest for the remainder of the day. A responsible adult should stay with you for 24 hours following the procedure.  For the next 24 hours, DO  NOT: -Drive a car -Advertising copywriter -Drink alcoholic beverages -Take any medication unless instructed by your physician -Make any legal decisions or sign important papers.  Meals: Start with liquid foods such as gelatin or soup. Progress to regular foods as tolerated. Avoid greasy, spicy, heavy foods. If nausea and/or vomiting occur, drink only clear liquids until the nausea and/or vomiting subsides. Call your physician if vomiting continues.  Special Instructions/Symptoms: Your throat may feel dry or sore from the anesthesia or the breathing tube placed in your throat during surgery. If this causes discomfort, gargle with warm salt water. The discomfort should disappear within 24 hours.   Call your surgeon if you experience:   1.  Fever over 101.0. 2.  Inability to urinate. 3.  Nausea and/or vomiting. 4.  Extreme swelling or bruising at the surgical site. 5.  Continued bleeding from the incision. 6.  Increased pain, redness or drainage from the incision. 7.  Problems related to your pain medication.

## 2011-12-03 ENCOUNTER — Encounter (HOSPITAL_BASED_OUTPATIENT_CLINIC_OR_DEPARTMENT_OTHER): Payer: Self-pay | Admitting: Surgery

## 2011-12-16 ENCOUNTER — Encounter (INDEPENDENT_AMBULATORY_CARE_PROVIDER_SITE_OTHER): Payer: Self-pay | Admitting: Surgery

## 2011-12-16 ENCOUNTER — Ambulatory Visit (INDEPENDENT_AMBULATORY_CARE_PROVIDER_SITE_OTHER): Payer: BC Managed Care – PPO | Admitting: Surgery

## 2011-12-16 VITALS — BP 120/72 | HR 74 | Temp 97.6°F | Resp 17 | Ht 66.0 in | Wt 212.8 lb

## 2011-12-16 DIAGNOSIS — K429 Umbilical hernia without obstruction or gangrene: Secondary | ICD-10-CM

## 2011-12-16 NOTE — Progress Notes (Signed)
Status post umbilical hernia repair with mesh on 12/02/11. The patient's incision is healing well. No sign of infection. No sign of recurrent hernia. The patient has no pain in this area. She may slowly begin increasing her level of activity. She may follow up with [place] on a p.r.n. Basis.  Wilmon Arms. Corliss Skains, MD, [facility] Surgery  12/16/2011 2:10 PM

## 2011-12-25 ENCOUNTER — Encounter (INDEPENDENT_AMBULATORY_CARE_PROVIDER_SITE_OTHER): Payer: Self-pay

## 2012-02-02 ENCOUNTER — Ambulatory Visit (HOSPITAL_COMMUNITY)
Admission: RE | Admit: 2012-02-02 | Discharge: 2012-02-02 | Disposition: A | Discharge status: Home / Self Care | Payer: BC Managed Care – PPO | Source: Ambulatory Visit | Attending: Family Medicine | Admitting: Family Medicine

## 2012-02-02 ENCOUNTER — Other Ambulatory Visit (HOSPITAL_COMMUNITY): Payer: Self-pay | Admitting: Family Medicine

## 2012-02-02 DIAGNOSIS — Z1231 Encounter for screening mammogram for malignant neoplasm of breast: Secondary | ICD-10-CM | POA: Insufficient documentation

## 2015-06-19 ENCOUNTER — Other Ambulatory Visit: Payer: Self-pay

## 2015-06-19 DIAGNOSIS — Z1231 Encounter for screening mammogram for malignant neoplasm of breast: Secondary | ICD-10-CM

## 2015-06-22 ENCOUNTER — Ambulatory Visit
Admission: RE | Admit: 2015-06-22 | Discharge: 2015-06-22 | Disposition: A | Discharge status: Home / Self Care | Payer: Medicare Other | Source: Ambulatory Visit

## 2015-06-22 DIAGNOSIS — Z1231 Encounter for screening mammogram for malignant neoplasm of breast: Secondary | ICD-10-CM

## 2017-01-27 ENCOUNTER — Other Ambulatory Visit: Payer: Self-pay | Admitting: Family Medicine

## 2017-01-27 DIAGNOSIS — Z1231 Encounter for screening mammogram for malignant neoplasm of breast: Secondary | ICD-10-CM

## 2017-02-13 ENCOUNTER — Ambulatory Visit
Admission: RE | Admit: 2017-02-13 | Discharge: 2017-02-13 | Disposition: A | Discharge status: Home / Self Care | Payer: Medicare Other | Source: Ambulatory Visit | Attending: Family Medicine | Admitting: Family Medicine

## 2017-02-13 DIAGNOSIS — Z1231 Encounter for screening mammogram for malignant neoplasm of breast: Secondary | ICD-10-CM

## 2018-04-28 ENCOUNTER — Other Ambulatory Visit: Payer: Self-pay | Admitting: Family Medicine

## 2018-04-28 DIAGNOSIS — Z1231 Encounter for screening mammogram for malignant neoplasm of breast: Secondary | ICD-10-CM

## 2018-05-04 ENCOUNTER — Other Ambulatory Visit: Payer: Self-pay | Admitting: Physician Assistant

## 2018-05-04 ENCOUNTER — Ambulatory Visit
Admission: RE | Admit: 2018-05-04 | Discharge: 2018-05-04 | Disposition: A | Discharge status: Home / Self Care | Payer: Medicare Other | Source: Ambulatory Visit | Attending: Physician Assistant | Admitting: Physician Assistant

## 2018-05-04 DIAGNOSIS — M5416 Radiculopathy, lumbar region: Secondary | ICD-10-CM

## 2018-06-10 ENCOUNTER — Ambulatory Visit: Payer: Medicare Other

## 2018-07-28 ENCOUNTER — Ambulatory Visit
Admission: RE | Admit: 2018-07-28 | Discharge: 2018-07-28 | Disposition: A | Discharge status: Home / Self Care | Payer: Medicare Other | Source: Ambulatory Visit | Attending: Family Medicine | Admitting: Family Medicine

## 2018-07-28 DIAGNOSIS — Z1231 Encounter for screening mammogram for malignant neoplasm of breast: Secondary | ICD-10-CM

## 2018-09-14 ENCOUNTER — Other Ambulatory Visit: Payer: Self-pay | Admitting: Neurosurgery

## 2018-09-27 NOTE — Pre-Procedure Instructions (Signed)
Shannon Garner  09/27/2018      [facility] [address], [place] [address] Phone: [phone] Fax: [phone]    Your procedure is scheduled on Tuesday April 21st.  Report to [facility] Admitting Entrance "A"- Admitting at 6:00 A.M.  Call this number if you have problems the morning of surgery:  [phone]   Remember:  Do not eat or drink after midnight.     Take these medicines the morning of surgery with A SIP OF WATER  FLUoxetine (PROZAC)  metoprolol succinate (TOPROL-XL) atorvastatin (LIPITOR) albuterol-ipratropium (COMBIVENT) if needed cyclobenzaprine (FLEXERIL) if needed  Follow your surgeon's instructions on when to stop Asprin.  If no instructions were given by your surgeon then you will need to call the office to get those instructions.    As of today, STOP taking any Aspirin(unless otherwise instructed by your surgeon), Aleve, Naproxen, Ibuprofen, Motrin, Advil, Goody's, BC's, all herbal medications, fish oil, and all vitamins   HOW TO MANAGE YOUR DIABETES BEFORE AND AFTER SURGERY  Why is it important to control my blood sugar before and after surgery? . Improving blood sugar levels before and after surgery helps healing and can limit problems. . A way of improving blood sugar control is eating a healthy diet by: o  Eating less sugar and carbohydrates o  Increasing activity/exercise o  Talking with your doctor about reaching your blood sugar goals . High blood sugars (greater than 180 mg/dL) can raise your risk of infections and slow your recovery, so you will need to focus on controlling your diabetes during the weeks before surgery. . Make sure that the doctor who takes care of your diabetes knows about your planned surgery including the date and location.  How do I manage my blood sugar before surgery? . Check your blood sugar at least 4 times a day, starting 2 days  before surgery, to make sure that the level is not too high or low. o Check your blood sugar the morning of your surgery when you wake up and every 2 hours until you get to the Short Stay unit. . If your blood sugar is less than 70 mg/dL, you will need to treat for low blood sugar: o Do not take insulin. o Treat a low blood sugar (less than 70 mg/dL) with  cup of clear juice (cranberry or apple), 4 glucose tablets, OR glucose gel. Recheck blood sugar in 15 minutes after treatment (to make sure it is greater than 70 mg/dL). If your blood sugar is not greater than 70 mg/dL on recheck, call [phone] o  for further instructions. . Report your blood sugar to the short stay nurse when you get to Short Stay.  . If you are admitted to the hospital after surgery: o Your blood sugar will be checked by the staff and you will probably be given insulin after surgery (instead of oral diabetes medicines) to make sure you have good blood sugar levels. o The goal for blood sugar control after surgery is 80-180 mg/dL.     WHAT DO I DO ABOUT MY DIABETES MEDICATION?   Marland Kitchen Do not take oral diabetes medicines (pills):  metFORMIN (GLUCOPHAGE-XR) the morning of surgery.     Do not wear jewelry, make-up or nail polish.  Do not wear lotions, powders, or perfumes, or deodorant.  Do not shave 48 hours prior to surgery.    Do not bring valuables to  the hospital.  [facility] is not responsible for any belongings or valuables.  Contacts, dentures or bridgework may not be worn into surgery.  Leave your suitcase in the car.  After surgery it may be brought to your room.  For patients admitted to the hospital, discharge time will be determined by your treatment team.  Patients discharged the day of surgery will not be allowed to drive home.   [facility]- Preparing For Surgery  Before surgery, you can play an important role. Because skin is not sterile, your skin needs to be as free of germs as possible. You  can reduce the number of germs on your skin by washing with CHG (chlorahexidine gluconate) Soap before surgery.  CHG is an antiseptic cleaner which kills germs and bonds with the skin to continue killing germs even after washing.    Oral Hygiene is also important to reduce your risk of infection.  Remember - BRUSH YOUR TEETH THE MORNING OF SURGERY WITH YOUR REGULAR TOOTHPASTE  Please do not use if you have an allergy to CHG or antibacterial soaps. If your skin becomes reddened/irritated stop using the CHG.  Do not shave (including legs and underarms) for at least 48 hours prior to first CHG shower. It is OK to shave your face.  Please follow these instructions carefully.   1. Shower the NIGHT BEFORE SURGERY and the MORNING OF SURGERY with CHG.   2. If you chose to wash your hair, wash your hair first as usual with your normal shampoo.  3. After you shampoo, rinse your hair and body thoroughly to remove the shampoo.  4. Use CHG as you would any other liquid soap. You can apply CHG directly to the skin and wash gently with a scrungie or a clean washcloth.   5. Apply the CHG Soap to your body ONLY FROM THE NECK DOWN.  Do not use on open wounds or open sores. Avoid contact with your eyes, ears, mouth and genitals (private parts). Wash Face and genitals (private parts)  with your normal soap.  6. Wash thoroughly, paying special attention to the area where your surgery will be performed.  7. Thoroughly rinse your body with warm water from the neck down.  8. DO NOT shower/wash with your normal soap after using and rinsing off the CHG Soap.  9. Pat yourself dry with a CLEAN TOWEL.  10. Wear CLEAN PAJAMAS to bed the night before surgery, wear comfortable clothes the morning of surgery  11. Place CLEAN SHEETS on your bed the night of your first shower and DO NOT SLEEP WITH PETS.   Day of Surgery: Shower as stated above. Do not apply any deodorants/lotions.  Please wear clean clothes to the  hospital/surgery center.   Remember to brush your teeth WITH YOUR REGULAR TOOTHPASTE.   Please read over the following fact sheets that you were given.

## 2018-09-28 ENCOUNTER — Other Ambulatory Visit: Payer: Self-pay

## 2018-09-28 ENCOUNTER — Encounter (HOSPITAL_COMMUNITY): Payer: Self-pay

## 2018-09-28 ENCOUNTER — Encounter (HOSPITAL_COMMUNITY)
Admission: RE | Admit: 2018-09-28 | Discharge: 2018-09-28 | Disposition: A | Discharge status: Home / Self Care | Payer: Medicare Other | Source: Ambulatory Visit | Attending: Neurosurgery | Admitting: Neurosurgery

## 2018-09-28 DIAGNOSIS — Z01818 Encounter for other preprocedural examination: Secondary | ICD-10-CM | POA: Diagnosis not present

## 2018-09-28 DIAGNOSIS — E119 Type 2 diabetes mellitus without complications: Secondary | ICD-10-CM | POA: Insufficient documentation

## 2018-09-28 DIAGNOSIS — I10 Essential (primary) hypertension: Secondary | ICD-10-CM | POA: Insufficient documentation

## 2018-09-28 HISTORY — DX: Malignant (primary) neoplasm, unspecified: C80.1

## 2018-09-28 LAB — CBC WITH DIFFERENTIAL/PLATELET
Abs Immature Granulocytes: 0.02 10*3/uL (ref 0.00–0.07)
Basophils Absolute: 0 10*3/uL (ref 0.0–0.1)
Basophils Relative: 0 %
Eosinophils Absolute: 0.3 10*3/uL (ref 0.0–0.5)
Eosinophils Relative: 4 %
HCT: 42.9 % (ref 36.0–46.0)
Hemoglobin: 14.3 g/dL (ref 12.0–15.0)
Immature Granulocytes: 0 %
Lymphocytes Relative: 22 %
Lymphs Abs: 2 10*3/uL (ref 0.7–4.0)
MCH: 31.1 pg (ref 26.0–34.0)
MCHC: 33.3 g/dL (ref 30.0–36.0)
MCV: 93.3 fL (ref 80.0–100.0)
Monocytes Absolute: 0.7 10*3/uL (ref 0.1–1.0)
Monocytes Relative: 8 %
Neutro Abs: 6.1 10*3/uL (ref 1.7–7.7)
Neutrophils Relative %: 66 %
Platelets: 181 10*3/uL (ref 150–400)
RBC: 4.6 MIL/uL (ref 3.87–5.11)
RDW: 12 % (ref 11.5–15.5)
WBC: 9.2 10*3/uL (ref 4.0–10.5)
nRBC: 0 % (ref 0.0–0.2)

## 2018-09-28 LAB — GLUCOSE, CAPILLARY: Glucose-Capillary: 97 mg/dL (ref 70–99)

## 2018-09-28 LAB — BASIC METABOLIC PANEL
Anion gap: 11 (ref 5–15)
BUN: 23 mg/dL (ref 8–23)
CO2: 26 mmol/L (ref 22–32)
Calcium: 10 mg/dL (ref 8.9–10.3)
Chloride: 103 mmol/L (ref 98–111)
Creatinine, Ser: 0.81 mg/dL (ref 0.44–1.00)
GFR calc Af Amer: >60 mL/min (ref >60)
GFR calc non Af Amer: >60 mL/min (ref >60)
Glucose, Bld: 102 mg/dL — ABNORMAL HIGH (ref 70–99)
Potassium: 3.8 mmol/L (ref 3.5–5.1)
Sodium: 140 mmol/L (ref 135–145)

## 2018-09-28 LAB — SURGICAL PCR SCREEN
MRSA, PCR: NEGATIVE
Staphylococcus aureus: POSITIVE — AB

## 2018-09-28 NOTE — Pre-Procedure Instructions (Signed)
Shannon Garner  09/28/2018       Your procedure is scheduled on Tuesday April 21st.  Report to [facility] Admitting Entrance "A"- Admitting at 6:00 A.M.             Your surgery or procedure is scheduled for 8: 00 AM  Call this number if you have problems the morning of surgery: [phone]  This is the number for the Pre- Surgical Desk.  DO NOT smoke within 24 hours prior to surgery.   Remember:  Do not eat or drink after midnight Monday, April 20.     Take these medicines the morning of surgery with A SIP OF WATER : FLUoxetine (PROZAC)  metoprolol succinate (TOPROL-XL) atorvastatin (LIPITOR)  Take if needed: albuterol-ipratropium (COMBIVENT)  Pease bring inhaler to the hospital with you. cyclobenzaprine (FLEXERIL)   . Do not take oral diabetes medicines (pills):  metFORMIN (GLUCOPHAGE-XR) the  morning of surgery.  Follow your surgeon's instructions on when to stop Asprin.  If no instructions were given by your surgeon then you will need to call the office to get those instructions.    As of today, STOP taking any Aspirin(unless otherwise instructed by your surgeon), Aleve, Naproxen, Ibuprofen, Motrin, Advil, Goody's, BC's, all herbal medications, fish oil, and all vitamins   HOW TO MANAGE YOUR DIABETES BEFORE AND AFTER SURGERY  Why is it important to control my blood sugar before and after surgery? . Improving blood sugar levels before and after surgery helps healing and can limit problems. . A way of improving blood sugar control is eating a healthy diet by: o  Eating less sugar and carbohydrates o  Increasing activity/exercise o  Talking with your doctor about reaching your blood sugar goals . High blood sugars (greater than 180 mg/dL) can raise your risk of infections and slow your recovery, so you will need to focus on controlling your diabetes during the weeks before surgery. . Make sure that the doctor who takes care of your diabetes knows about your  planned surgery including the date and location.  How do I manage my blood sugar before surgery? . Check your blood sugar at least 4 times a day, starting 2 days before surgery, to make sure that the level is not too high or low. o Check your blood sugar the morning of your surgery when you wake up and every 2 hours until you get to the Short Stay unit. . If your blood sugar is less than 70 mg/dL, you will need to treat for low blood sugar: o Do not take insulin. o Treat a low blood sugar (less than 70 mg/dL) with  cup of clear juice (cranberry or apple), 4 glucose tablets, OR glucose gel. Recheck blood sugar in 15 minutes after treatment (to make sure it is greater than 70 mg/dL). If your blood sugar is not greater than 70 mg/dL on recheck, call [phone] o  for further instructions. . Report your blood sugar to the short stay nurse when you get to Short Stay.  . If you are admitted to the hospital after surgery: o Your blood sugar will be checked by the staff and you will probably be given insulin after surgery (instead of oral diabetes medicines) to make sure you have good blood sugar levels. o The goal for blood sugar control after surgery is 80-180 mg/dL.    [facility]- Preparing For Surgery  Before surgery, you can play an important role. Because skin is not sterile,  your skin needs to be as free of germs as possible. You can reduce the number of germs on your skin by washing with CHG (chlorahexidine gluconate) Soap before surgery.  CHG is an antiseptic cleaner which kills germs and bonds with the skin to continue killing germs even after washing.    Oral Hygiene is also important to reduce your risk of infection.  Remember - BRUSH YOUR TEETH THE MORNING OF SURGERY WITH YOUR REGULAR TOOTHPASTE  Please do not use if you have an allergy to CHG or antibacterial soaps. If your skin becomes reddened/irritated stop using the CHG.  Do not shave (including legs and underarms) for at least  48 hours prior to first CHG shower. It is OK to shave your face.  Please follow these instructions carefully.   1. Shower the NIGHT BEFORE SURGERY and the MORNING OF SURGERY with CHG.   2. If you chose to wash your hair, wash your hair first as usual with your normal shampoo.  3. After you shampoo wash your face and private area with the soap you use at home, then rinse your hair and body thoroughly to remove the shampoo and soap.  4. Use CHG as you would any other liquid soap. You can apply CHG directly to the skin and wash gently with a scrungie or a clean washcloth.   5. Apply the CHG Soap to your body ONLY FROM THE NECK DOWN.  Do not use on open wounds or open sores. Avoid contact with your eyes, ears, mouth and genitals (private parts).  6. Wash thoroughly, paying special attention to the area where your surgery will be performed.  7. Thoroughly rinse your body with warm water from the neck down.  8. DO NOT shower/wash with your normal soap after using and rinsing off the CHG Soap.  9. Pat yourself dry with a CLEAN TOWEL.  10. Wear CLEAN PAJAMAS to bed the night before surgery, wear comfortable clothes the morning of surgery  11. Place CLEAN SHEETS on your bed the night of your first shower and DO NOT SLEEP WITH PETS.  Day of Surgery: Shower as stated above. Do not apply any deodorants/lotions, powders or colognes..  Please wear clean clothes to the hospital/surgery center.   Remember to brush your teeth WITH YOUR REGULAR TOOTHPASTE.   Do not wear jewelry, make-up or nail polish.  Do Not shave 48 hours prior to surgery.    Do not bring valuables to the hospital.  [facility] is not responsible for any belongings or valuables.  Contacts, dentures or bridgework may not be worn into surgery.  Leave your suitcase in the car.  After surgery it may be brought to your room.  For patients admitted to the hospital, discharge time will be determined by your treatment  team.  Patients discharged the day of surgery will not be allowed to drive home.   Please read over the following fact sheets that you were given.

## 2018-09-28 NOTE — Progress Notes (Signed)
PCP - Bing Matter Cardiologist - denies  Chest x-ray - N/A EKG - 09/28/2018  ECHO - 2012 Cardiac Cath - 2003  DM - yes, Type 2 Fasting Blood Sugar - Does not check sugars at home   Blood Thinner Instructions: Aspirin Instructions: Follow surgeon's instructions on when to stop ASA  Anesthesia review: yes, heart history, EKG Jeneen Rinks called at PAT appt - per Jeneen Rinks, EKG ok, similar to old EKG  Patient denies shortness of breath, fever, cough and chest pain at PAT appointment   Patient verbalized understanding of instructions that were given to them at the PAT appointment. Patient was also instructed that they will need to review over the PAT instructions again at home before surgery.

## 2018-09-28 NOTE — Progress Notes (Signed)
PCR: + Staph  Prescription called in to [facility].  Pt called and notified @ 1545.

## 2018-09-29 LAB — HEMOGLOBIN A1C
Hgb A1c MFr Bld: 5.9 % — ABNORMAL HIGH (ref 4.8–5.6)
Mean Plasma Glucose: 123 mg/dL

## 2018-09-29 NOTE — Anesthesia Preprocedure Evaluation (Addendum)
Anesthesia Evaluation  Patient identified by MRN, date of birth, ID band Patient awake    Reviewed: Allergy & Precautions  Airway Mallampati: II  TM Distance: >3 FB     Dental   Pulmonary Current Smoker,    breath sounds clear to auscultation       Cardiovascular hypertension,  Rhythm:Regular Rate:Normal     Neuro/Psych    GI/Hepatic   Endo/Other  diabetes  Renal/GU Renal disease     Musculoskeletal   Abdominal   Peds  Hematology   Anesthesia Other Findings   Reproductive/Obstetrics                          Anesthesia Physical Anesthesia Plan  ASA: III  Anesthesia Plan: General   Post-op Pain Management:    Induction: Intravenous  PONV Risk Score and Plan: Ondansetron, Dexamethasone and Midazolam  Airway Management Planned: Oral ETT  Additional Equipment:   Intra-op Plan:   Post-operative Plan: Extubation in OR  Informed Consent: I have reviewed the patients History and Physical, chart, labs and discussed the procedure including the risks, benefits and alternatives for the proposed anesthesia with the patient or authorized representative who has indicated his/her understanding and acceptance.     Dental advisory given  Plan Discussed with: Anesthesiologist and CRNA  Anesthesia Plan Comments: (PAT note written 09/29/2018 by Myra Gianotti, PA-C. )      Anesthesia Quick Evaluation

## 2018-09-29 NOTE — Progress Notes (Signed)
Anesthesia Chart Review:  Case:  [id number] Date/Time:  10/05/18 0745   Procedure:  Microdiscectomy - right - L2-L3 (Right Back)   Anesthesia type:  General   Pre-op diagnosis:  HNP   Location:  [facility] OR ROOM 32 / [facility]   Surgeon:  Earnie Larsson, MD      DISCUSSION: Patient is a 68 year old female scheduled for the above procedure.  History includes smoking, DM2, HLD, HTN, renal cysts, skin cancer, migraines. BMI is consistent with mild obesity. She had normal coronaries by 2003 cath.  Patient reported being instructed to hold ASA for 7 days prior to surgery.  EKG and labs are stable. She denied SOB, cough, fever, and chest pain at PAT RN visit. If no acute changes then I would anticipate that she can proceed as planned.   VS: BP (!) 159/88   Pulse 71   Temp 36.7 C (Oral)   Resp 16   Ht 5\' 3"  (1.6 m)   Wt 79.4 kg   BMI 31.00 kg/m   PROVIDERS: Aletha Halim., PA-C is PCP   LABS: Labs reviewed: Acceptable for surgery. (all labs ordered are listed, but only abnormal results are displayed)  Labs Reviewed  SURGICAL PCR SCREEN - Abnormal; Notable for the following components:      Result Value   Staphylococcus aureus POSITIVE (*)    All other components within normal limits  BASIC METABOLIC PANEL - Abnormal; Notable for the following components:   Glucose, Bld 102 (*)    All other components within normal limits  HEMOGLOBIN A1C - Abnormal; Notable for the following components:   Hgb A1c MFr Bld 5.9 (*)    All other components within normal limits  GLUCOSE, CAPILLARY  CBC WITH DIFFERENTIAL/PLATELET    EKG: 09/28/18: Normal sinus rhythm Minimal voltage criteria for LVH, may be normal variant Cannot rule out Anterior infarct , age undetermined versus lead placement Abnormal ECG No significant change since last tracing Confirmed by End, Harrell Gave [phone]) on 09/29/2018 8:39:35 AM   CV: "Echocardiogram" 12/31/10 x4 scanned under Result Review tab are all actually just EKG  tracings that show SB at 54 bpm, possible old anterior infarct.  Cardiac cath 02/08/02 Einar Gip, Ulice Dash, MD, [facility]): IMPRESSION:  1. Normal coronary arteries.  2. Normal left ventricular systolic function.  3. Chest pain probably secondary to gastroesophageal reflux disease.   Past Medical History:  Diagnosis Date  . Acquired cyst of kidney    Cysts on outside & inside of kidneys  . Allergy   . Anxiety   . Arthritis   . Bronchitis, not specified as acute or chronic   . Cancer ([place])    skin cancer  . Cellulitis and abscess of buttock   . Cervicalgia   . Depression   . Diabetes mellitus   . Hyperlipidemia   . Hypertension   . Lumbago   . Migraine without aura, without mention of intractable migraine without mention of status migrainosus   . Osteoarthrosis, unspecified whether generalized or localized, unspecified site   . Pain in joint, lower leg   . Pain in joint, shoulder region   . Proteinuria     Past Surgical History:  Procedure Laterality Date  . ABDOMINAL HYSTERECTOMY    . Bone Spurs Removed Left    Left Heel  . CARPAL TUNNEL RELEASE     rt/lt  . CERVICAL FUSION  2009  . COLONOSCOPY    . Corrective rectal surgery    . CYSTECTOMY  On vertebrae. Rods put in as well  . EYE SURGERY     lt cataract  . HERNIA REPAIR  12/02/2011  . KIDNEY SURGERY     cysts on right kidney  . SPINE SURGERY  2011   cysts on spine  . TOTAL KNEE ARTHROPLASTY     Right knee  . TUBAL LIGATION    . UMBILICAL HERNIA REPAIR  12/02/2011   Procedure: HERNIA REPAIR UMBILICAL ADULT;  Surgeon: Imogene Burn. Georgette Dover, MD;  Location: [place];  Service: General;  Laterality: N/A;  Umbilical hernia repair with mesh    MEDICATIONS: . HYDROcodone-acetaminophen (NORCO/VICODIN) 5-325 MG tablet  . albuterol-ipratropium (COMBIVENT) 18-103 MCG/ACT inhaler  . aspirin 81 MG tablet  . atorvastatin (LIPITOR) 40 MG tablet  . cholecalciferol (VITAMIN D3) 25 MCG (1000 UT) tablet  .  cyclobenzaprine (FLEXERIL) 10 MG tablet  . FLUoxetine (PROZAC) 20 MG capsule  . hydrochlorothiazide (HYDRODIURIL) 25 MG tablet  . hyoscyamine (LEVSINEX) 0.375 MG 12 hr capsule  . lisinopril (PRINIVIL,ZESTRIL) 20 MG tablet  . Lite Touch Lancets MISC  . meloxicam (MOBIC) 15 MG tablet  . metFORMIN (GLUCOPHAGE-XR) 750 MG 24 hr tablet  . metoprolol succinate (TOPROL-XL) 50 MG 24 hr tablet  . ONE TOUCH ULTRA TEST test strip  . promethazine (PHENERGAN) 25 MG tablet  . temazepam (RESTORIL) 15 MG capsule  . varenicline (CHANTIX) 1 MG tablet  . vitamin B-12 (CYANOCOBALAMIN) 1000 MCG tablet   No current facility-administered medications for this encounter.     Myra Gianotti, PA-C Surgical Short Stay/Anesthesiology [facility] Phone [phone] [facility] Phone [phone] 09/29/2018 12:04 PM

## 2018-10-05 ENCOUNTER — Other Ambulatory Visit: Payer: Self-pay

## 2018-10-05 ENCOUNTER — Encounter (HOSPITAL_COMMUNITY)
Admission: RE | Disposition: A | Discharge status: Home / Self Care | Payer: Self-pay | Source: Home / Self Care | Attending: Neurosurgery

## 2018-10-05 ENCOUNTER — Ambulatory Visit (HOSPITAL_COMMUNITY): Payer: Medicare Other

## 2018-10-05 ENCOUNTER — Encounter (HOSPITAL_COMMUNITY): Payer: Self-pay | Admitting: *Deleted

## 2018-10-05 ENCOUNTER — Ambulatory Visit (HOSPITAL_COMMUNITY): Payer: Medicare Other | Admitting: Certified Registered Nurse Anesthetist

## 2018-10-05 ENCOUNTER — Observation Stay (HOSPITAL_COMMUNITY)
Admission: RE | Admit: 2018-10-05 | Discharge: 2018-10-06 | Disposition: A | Discharge status: Home / Self Care | Payer: Medicare Other | Attending: Neurosurgery | Admitting: Neurosurgery

## 2018-10-05 ENCOUNTER — Ambulatory Visit (HOSPITAL_COMMUNITY): Payer: Medicare Other | Admitting: Physician Assistant

## 2018-10-05 DIAGNOSIS — E119 Type 2 diabetes mellitus without complications: Secondary | ICD-10-CM | POA: Diagnosis not present

## 2018-10-05 DIAGNOSIS — Z96651 Presence of right artificial knee joint: Secondary | ICD-10-CM | POA: Diagnosis not present

## 2018-10-05 DIAGNOSIS — I10 Essential (primary) hypertension: Secondary | ICD-10-CM | POA: Insufficient documentation

## 2018-10-05 DIAGNOSIS — M1990 Unspecified osteoarthritis, unspecified site: Secondary | ICD-10-CM | POA: Diagnosis not present

## 2018-10-05 DIAGNOSIS — Z881 Allergy status to other antibiotic agents status: Secondary | ICD-10-CM | POA: Diagnosis not present

## 2018-10-05 DIAGNOSIS — M5116 Intervertebral disc disorders with radiculopathy, lumbar region: Secondary | ICD-10-CM | POA: Diagnosis not present

## 2018-10-05 DIAGNOSIS — M5416 Radiculopathy, lumbar region: Secondary | ICD-10-CM | POA: Diagnosis present

## 2018-10-05 DIAGNOSIS — Z79899 Other long term (current) drug therapy: Secondary | ICD-10-CM | POA: Insufficient documentation

## 2018-10-05 DIAGNOSIS — F17210 Nicotine dependence, cigarettes, uncomplicated: Secondary | ICD-10-CM | POA: Insufficient documentation

## 2018-10-05 DIAGNOSIS — F419 Anxiety disorder, unspecified: Secondary | ICD-10-CM | POA: Insufficient documentation

## 2018-10-05 DIAGNOSIS — M48061 Spinal stenosis, lumbar region without neurogenic claudication: Secondary | ICD-10-CM | POA: Diagnosis not present

## 2018-10-05 DIAGNOSIS — Z419 Encounter for procedure for purposes other than remedying health state, unspecified: Secondary | ICD-10-CM

## 2018-10-05 DIAGNOSIS — Z7984 Long term (current) use of oral hypoglycemic drugs: Secondary | ICD-10-CM | POA: Diagnosis not present

## 2018-10-05 DIAGNOSIS — Z791 Long term (current) use of non-steroidal anti-inflammatories (NSAID): Secondary | ICD-10-CM | POA: Insufficient documentation

## 2018-10-05 DIAGNOSIS — E785 Hyperlipidemia, unspecified: Secondary | ICD-10-CM | POA: Insufficient documentation

## 2018-10-05 DIAGNOSIS — Z7982 Long term (current) use of aspirin: Secondary | ICD-10-CM | POA: Insufficient documentation

## 2018-10-05 DIAGNOSIS — F329 Major depressive disorder, single episode, unspecified: Secondary | ICD-10-CM | POA: Diagnosis not present

## 2018-10-05 HISTORY — PX: LUMBAR LAMINECTOMY/DECOMPRESSION MICRODISCECTOMY: SHX5026

## 2018-10-05 LAB — GLUCOSE, CAPILLARY
Glucose-Capillary: 111 mg/dL — ABNORMAL HIGH (ref 70–99)
Glucose-Capillary: 105 mg/dL — ABNORMAL HIGH (ref 70–99)
Glucose-Capillary: 142 mg/dL — ABNORMAL HIGH (ref 70–99)
Glucose-Capillary: 250 mg/dL — ABNORMAL HIGH (ref 70–99)

## 2018-10-05 SURGERY — LUMBAR LAMINECTOMY/DECOMPRESSION MICRODISCECTOMY 1 LEVEL
Anesthesia: General | Site: Back | Laterality: Right

## 2018-10-05 MED ORDER — HYDROCHLOROTHIAZIDE 25 MG PO TABS
25.0000 mg | ORAL_TABLET | Freq: Every day | ORAL | Status: DC
  Administered 2018-10-05 – 2018-10-06 (×2): 25 mg via ORAL
  Filled 2018-10-05 (×2): qty 1

## 2018-10-05 MED ORDER — IPRATROPIUM-ALBUTEROL 18-103 MCG/ACT IN AERO: 2.0000 | INHALATION_SPRAY | Freq: Four times a day (QID) | RESPIRATORY_TRACT | Status: DC | PRN

## 2018-10-05 MED ORDER — BUPIVACAINE HCL (PF) 0.25 % IJ SOLN
INTRAMUSCULAR | Status: DC | PRN
  Administered 2018-10-05: 20 mL

## 2018-10-05 MED ORDER — METFORMIN HCL ER 750 MG PO TB24
750.0000 mg | ORAL_TABLET | Freq: Every day | ORAL | Status: DC
  Administered 2018-10-06: 08:00:00 750 mg via ORAL
  Filled 2018-10-05 (×2): qty 1

## 2018-10-05 MED ORDER — 0.9 % SODIUM CHLORIDE (POUR BTL) OPTIME
TOPICAL | Status: DC | PRN
  Administered 2018-10-05: 08:00:00 1000 mL

## 2018-10-05 MED ORDER — LIDOCAINE 2% (20 MG/ML) 5 ML SYRINGE
INTRAMUSCULAR | Status: AC
  Filled 2018-10-05: qty 5

## 2018-10-05 MED ORDER — PHENYLEPHRINE 40 MCG/ML (10ML) SYRINGE FOR IV PUSH (FOR BLOOD PRESSURE SUPPORT)
PREFILLED_SYRINGE | INTRAVENOUS | Status: AC
  Filled 2018-10-05: qty 10

## 2018-10-05 MED ORDER — SODIUM CHLORIDE 0.9% FLUSH: 3.0000 mL | INTRAVENOUS | Status: DC | PRN

## 2018-10-05 MED ORDER — FENTANYL CITRATE (PF) 100 MCG/2ML IJ SOLN
INTRAMUSCULAR | Status: DC | PRN
  Administered 2018-10-05: 100 ug via INTRAVENOUS
  Administered 2018-10-05: 25 ug via INTRAVENOUS
  Administered 2018-10-05: 50 ug via INTRAVENOUS
  Administered 2018-10-05: 25 ug via INTRAVENOUS
  Administered 2018-10-05: 50 ug via INTRAVENOUS

## 2018-10-05 MED ORDER — ROCURONIUM BROMIDE 50 MG/5ML IV SOSY
PREFILLED_SYRINGE | INTRAVENOUS | Status: AC
  Filled 2018-10-05: qty 5

## 2018-10-05 MED ORDER — CYCLOBENZAPRINE HCL 10 MG PO TABS: 10.0000 mg | ORAL_TABLET | Freq: Three times a day (TID) | ORAL | Status: DC | PRN

## 2018-10-05 MED ORDER — ONDANSETRON HCL 4 MG/2ML IJ SOLN
INTRAMUSCULAR | Status: AC
  Filled 2018-10-05: qty 2

## 2018-10-05 MED ORDER — VITAMIN D 25 MCG (1000 UNIT) PO TABS: 1000.0000 [IU] | ORAL_TABLET | Freq: Every day | ORAL | Status: DC

## 2018-10-05 MED ORDER — SUCCINYLCHOLINE CHLORIDE 200 MG/10ML IV SOSY
PREFILLED_SYRINGE | INTRAVENOUS | Status: DC | PRN
  Administered 2018-10-05: 140 mg via INTRAVENOUS

## 2018-10-05 MED ORDER — MIDAZOLAM HCL 5 MG/5ML IJ SOLN
INTRAMUSCULAR | Status: DC | PRN
  Administered 2018-10-05: 2 mg via INTRAVENOUS

## 2018-10-05 MED ORDER — HYDROMORPHONE HCL 1 MG/ML IJ SOLN: 1.0000 mg | INTRAMUSCULAR | Status: DC | PRN

## 2018-10-05 MED ORDER — ATORVASTATIN CALCIUM 40 MG PO TABS
40.0000 mg | ORAL_TABLET | Freq: Every day | ORAL | Status: DC
  Administered 2018-10-06: 40 mg via ORAL
  Filled 2018-10-05: qty 1

## 2018-10-05 MED ORDER — ACETAMINOPHEN 325 MG PO TABS: 650.0000 mg | ORAL_TABLET | ORAL | Status: DC | PRN

## 2018-10-05 MED ORDER — PROMETHAZINE HCL 25 MG PO TABS: 25.0000 mg | ORAL_TABLET | Freq: Four times a day (QID) | ORAL | Status: DC | PRN

## 2018-10-05 MED ORDER — VARENICLINE TARTRATE 1 MG PO TABS
1.0000 mg | ORAL_TABLET | Freq: Two times a day (BID) | ORAL | Status: DC
  Administered 2018-10-05 – 2018-10-06 (×2): 1 mg via ORAL
  Filled 2018-10-05 (×3): qty 1

## 2018-10-05 MED ORDER — LISINOPRIL 20 MG PO TABS
20.0000 mg | ORAL_TABLET | Freq: Every day | ORAL | Status: DC
  Administered 2018-10-05 – 2018-10-06 (×2): 20 mg via ORAL
  Filled 2018-10-05 (×2): qty 1

## 2018-10-05 MED ORDER — TEMAZEPAM 7.5 MG PO CAPS
15.0000 mg | ORAL_CAPSULE | Freq: Every evening | ORAL | Status: DC | PRN
  Administered 2018-10-05: 15 mg via ORAL
  Filled 2018-10-05: qty 2

## 2018-10-05 MED ORDER — THROMBIN 5000 UNITS EX SOLR
CUTANEOUS | Status: AC
  Filled 2018-10-05: qty 5000

## 2018-10-05 MED ORDER — MIDAZOLAM HCL 2 MG/2ML IJ SOLN
INTRAMUSCULAR | Status: AC
  Filled 2018-10-05: qty 2

## 2018-10-05 MED ORDER — SODIUM CHLORIDE 0.9% FLUSH
3.0000 mL | Freq: Two times a day (BID) | INTRAVENOUS | Status: DC
  Administered 2018-10-06: 08:00:00 3 mL via INTRAVENOUS

## 2018-10-05 MED ORDER — LACTATED RINGERS IV SOLN
INTRAVENOUS | Status: DC
  Administered 2018-10-05: 07:00:00 via INTRAVENOUS

## 2018-10-05 MED ORDER — ROCURONIUM BROMIDE 50 MG/5ML IV SOSY
PREFILLED_SYRINGE | INTRAVENOUS | Status: DC | PRN
  Administered 2018-10-05: 40 mg via INTRAVENOUS

## 2018-10-05 MED ORDER — HYDROCODONE-ACETAMINOPHEN 10-325 MG PO TABS: 2.0000 | ORAL_TABLET | ORAL | Status: DC | PRN

## 2018-10-05 MED ORDER — VANCOMYCIN HCL 1000 MG IV SOLR
1000.0000 mg | Freq: Once | INTRAVENOUS | Status: DC
  Filled 2018-10-05: qty 1000

## 2018-10-05 MED ORDER — SUGAMMADEX SODIUM 200 MG/2ML IV SOLN
INTRAVENOUS | Status: DC | PRN
  Administered 2018-10-05: 175 mg via INTRAVENOUS

## 2018-10-05 MED ORDER — MENTHOL 3 MG MT LOZG: 1.0000 | LOZENGE | OROMUCOSAL | Status: DC | PRN

## 2018-10-05 MED ORDER — KETOROLAC TROMETHAMINE 15 MG/ML IJ SOLN
30.0000 mg | Freq: Four times a day (QID) | INTRAMUSCULAR | Status: AC
  Administered 2018-10-05 – 2018-10-06 (×4): 30 mg via INTRAVENOUS
  Filled 2018-10-05 (×4): qty 2

## 2018-10-05 MED ORDER — FENTANYL CITRATE (PF) 100 MCG/2ML IJ SOLN
25.0000 ug | INTRAMUSCULAR | Status: DC | PRN
  Administered 2018-10-05: 50 ug via INTRAVENOUS

## 2018-10-05 MED ORDER — LIDOCAINE 2% (20 MG/ML) 5 ML SYRINGE
INTRAMUSCULAR | Status: DC | PRN
  Administered 2018-10-05: 80 mg via INTRAVENOUS

## 2018-10-05 MED ORDER — SUCCINYLCHOLINE CHLORIDE 200 MG/10ML IV SOSY
PREFILLED_SYRINGE | INTRAVENOUS | Status: AC
  Filled 2018-10-05: qty 10

## 2018-10-05 MED ORDER — EPHEDRINE SULFATE-NACL 50-0.9 MG/10ML-% IV SOSY
PREFILLED_SYRINGE | INTRAVENOUS | Status: DC | PRN
  Administered 2018-10-05: 5 mg via INTRAVENOUS

## 2018-10-05 MED ORDER — FLUOXETINE HCL 20 MG PO CAPS
20.0000 mg | ORAL_CAPSULE | Freq: Every day | ORAL | Status: DC
  Administered 2018-10-06: 20 mg via ORAL
  Filled 2018-10-05: qty 1

## 2018-10-05 MED ORDER — FENTANYL CITRATE (PF) 100 MCG/2ML IJ SOLN
INTRAMUSCULAR | Status: AC
  Filled 2018-10-05: qty 2

## 2018-10-05 MED ORDER — ONDANSETRON HCL 4 MG/2ML IJ SOLN
INTRAMUSCULAR | Status: DC | PRN
  Administered 2018-10-05: 4 mg via INTRAVENOUS

## 2018-10-05 MED ORDER — ONDANSETRON HCL 4 MG PO TABS: 4.0000 mg | ORAL_TABLET | Freq: Four times a day (QID) | ORAL | Status: DC | PRN

## 2018-10-05 MED ORDER — BUPIVACAINE HCL (PF) 0.25 % IJ SOLN
INTRAMUSCULAR | Status: AC
  Filled 2018-10-05: qty 30

## 2018-10-05 MED ORDER — METOPROLOL SUCCINATE ER 25 MG PO TB24
50.0000 mg | ORAL_TABLET | Freq: Every day | ORAL | Status: DC
  Administered 2018-10-06: 08:00:00 50 mg via ORAL
  Filled 2018-10-05: qty 2

## 2018-10-05 MED ORDER — CHLORHEXIDINE GLUCONATE CLOTH 2 % EX PADS: 6.0000 | MEDICATED_PAD | Freq: Once | CUTANEOUS | Status: DC

## 2018-10-05 MED ORDER — HYOSCYAMINE SULFATE ER 0.375 MG PO TB12
0.3750 mg | ORAL_TABLET | Freq: Two times a day (BID) | ORAL | Status: DC | PRN
  Filled 2018-10-05: qty 1

## 2018-10-05 MED ORDER — DEXAMETHASONE SODIUM PHOSPHATE 10 MG/ML IJ SOLN
10.0000 mg | INTRAMUSCULAR | Status: AC
  Administered 2018-10-05: 08:00:00 10 mg via INTRAVENOUS
  Filled 2018-10-05: qty 1

## 2018-10-05 MED ORDER — KETOROLAC TROMETHAMINE 30 MG/ML IJ SOLN
INTRAMUSCULAR | Status: DC | PRN
  Administered 2018-10-05: 30 mg via INTRAVENOUS

## 2018-10-05 MED ORDER — ACETAMINOPHEN 650 MG RE SUPP: 650.0000 mg | RECTAL | Status: DC | PRN

## 2018-10-05 MED ORDER — PROPOFOL 10 MG/ML IV BOLUS
INTRAVENOUS | Status: DC | PRN
  Administered 2018-10-05: 160 mg via INTRAVENOUS

## 2018-10-05 MED ORDER — THROMBIN 5000 UNITS EX SOLR
CUTANEOUS | Status: DC | PRN
  Administered 2018-10-05 (×2): 5000 [IU] via TOPICAL

## 2018-10-05 MED ORDER — ASPIRIN 81 MG PO TABS: 81.0000 mg | ORAL_TABLET | Freq: Every day | ORAL | Status: DC

## 2018-10-05 MED ORDER — INSULIN ASPART 100 UNIT/ML ~~LOC~~ SOLN
0.0000 [IU] | Freq: Three times a day (TID) | SUBCUTANEOUS | Status: DC
  Administered 2018-10-05: 18:00:00 5 [IU] via SUBCUTANEOUS
  Administered 2018-10-06: 08:00:00 3 [IU] via SUBCUTANEOUS

## 2018-10-05 MED ORDER — PHENOL 1.4 % MT LIQD: 1.0000 | OROMUCOSAL | Status: DC | PRN

## 2018-10-05 MED ORDER — ONDANSETRON HCL 4 MG/2ML IJ SOLN: 4.0000 mg | Freq: Four times a day (QID) | INTRAMUSCULAR | Status: DC | PRN

## 2018-10-05 MED ORDER — VITAMIN B-12 1000 MCG PO TABS: 1000.0000 ug | ORAL_TABLET | Freq: Every day | ORAL | Status: DC

## 2018-10-05 MED ORDER — SODIUM CHLORIDE 0.9 % IV SOLN
250.0000 mL | INTRAVENOUS | Status: DC
  Administered 2018-10-05 (×2): 250 mL via INTRAVENOUS

## 2018-10-05 MED ORDER — HYDROCODONE-ACETAMINOPHEN 5-325 MG PO TABS
1.0000 | ORAL_TABLET | ORAL | Status: DC | PRN
  Administered 2018-10-05 (×2): 1 via ORAL
  Filled 2018-10-05 (×2): qty 1

## 2018-10-05 MED ORDER — PHENYLEPHRINE 40 MCG/ML (10ML) SYRINGE FOR IV PUSH (FOR BLOOD PRESSURE SUPPORT)
PREFILLED_SYRINGE | INTRAVENOUS | Status: DC | PRN
  Administered 2018-10-05 (×5): 80 ug via INTRAVENOUS

## 2018-10-05 MED ORDER — FENTANYL CITRATE (PF) 250 MCG/5ML IJ SOLN
INTRAMUSCULAR | Status: AC
  Filled 2018-10-05: qty 5

## 2018-10-05 MED ORDER — VANCOMYCIN HCL IN DEXTROSE 1-5 GM/200ML-% IV SOLN
1000.0000 mg | Freq: Once | INTRAVENOUS | Status: AC
  Administered 2018-10-05: 23:00:00 1000 mg via INTRAVENOUS
  Filled 2018-10-05 (×3): qty 200

## 2018-10-05 MED ORDER — HEMOSTATIC AGENTS (NO CHARGE) OPTIME
TOPICAL | Status: DC | PRN
  Administered 2018-10-05: 1

## 2018-10-05 MED ORDER — SODIUM CHLORIDE 0.9 % IV SOLN
INTRAVENOUS | Status: DC | PRN
  Administered 2018-10-05: 09:00:00 25 ug/min via INTRAVENOUS

## 2018-10-05 MED ORDER — VANCOMYCIN HCL IN DEXTROSE 1-5 GM/200ML-% IV SOLN
1000.0000 mg | INTRAVENOUS | Status: AC
  Administered 2018-10-05: 1000 mg via INTRAVENOUS
  Filled 2018-10-05: qty 200

## 2018-10-05 MED ORDER — SODIUM CHLORIDE 0.9 % IV SOLN
INTRAVENOUS | Status: DC | PRN
  Administered 2018-10-05: 08:00:00 500 mL

## 2018-10-05 MED ORDER — PROPOFOL 10 MG/ML IV BOLUS
INTRAVENOUS | Status: AC
  Filled 2018-10-05: qty 40

## 2018-10-05 SURGICAL SUPPLY — 49 items
ADH SKN CLS APL DERMABOND .7 (GAUZE/BANDAGES/DRESSINGS) ×1
APL SKNCLS STERI-STRIP NONHPOA (GAUZE/BANDAGES/DRESSINGS) ×1
BAG DECANTER FOR FLEXI CONT (MISCELLANEOUS) ×2 IMPLANT
BENZOIN TINCTURE PRP APPL 2/3 (GAUZE/BANDAGES/DRESSINGS) ×2 IMPLANT
BLADE CLIPPER SURG (BLADE) IMPLANT
BUR CUTTER 7.0 ROUND (BURR) ×2 IMPLANT
CANISTER SUCT 3000ML PPV (MISCELLANEOUS) ×2 IMPLANT
CARTRIDGE OIL MAESTRO DRILL (MISCELLANEOUS) ×1 IMPLANT
COVER WAND RF STERILE (DRAPES) ×1 IMPLANT
DECANTER SPIKE VIAL GLASS SM (MISCELLANEOUS) ×2 IMPLANT
DERMABOND ADVANCED (GAUZE/BANDAGES/DRESSINGS) ×1
DERMABOND ADVANCED .7 DNX12 (GAUZE/BANDAGES/DRESSINGS) ×1 IMPLANT
DIFFUSER DRILL AIR PNEUMATIC (MISCELLANEOUS) ×2 IMPLANT
DRAPE HALF SHEET 40X57 (DRAPES) IMPLANT
DRAPE LAPAROTOMY 100X72X124 (DRAPES) ×2 IMPLANT
DRAPE MICROSCOPE LEICA (MISCELLANEOUS) ×2 IMPLANT
DRAPE SURG 17X23 STRL (DRAPES) ×4 IMPLANT
DRSG OPSITE POSTOP 4X6 (GAUZE/BANDAGES/DRESSINGS) ×1 IMPLANT
DURAPREP 26ML APPLICATOR (WOUND CARE) ×2 IMPLANT
ELECT REM PT RETURN 9FT ADLT (ELECTROSURGICAL) ×2
ELECTRODE REM PT RTRN 9FT ADLT (ELECTROSURGICAL) ×1 IMPLANT
GAUZE 4X4 16PLY RFD (DISPOSABLE) IMPLANT
GAUZE SPONGE 4X4 12PLY STRL (GAUZE/BANDAGES/DRESSINGS) ×2 IMPLANT
GLOVE BIO SURGEON STRL SZ 6.5 (GLOVE) ×5 IMPLANT
GLOVE BIOGEL PI IND STRL 6.5 (GLOVE) IMPLANT
GLOVE BIOGEL PI INDICATOR 6.5 (GLOVE) ×4
GLOVE ECLIPSE 9.0 STRL (GLOVE) ×2 IMPLANT
GOWN STRL REUS W/ TWL LRG LVL3 (GOWN DISPOSABLE) IMPLANT
GOWN STRL REUS W/ TWL XL LVL3 (GOWN DISPOSABLE) ×1 IMPLANT
GOWN STRL REUS W/TWL 2XL LVL3 (GOWN DISPOSABLE) IMPLANT
GOWN STRL REUS W/TWL LRG LVL3 (GOWN DISPOSABLE) ×6
GOWN STRL REUS W/TWL XL LVL3 (GOWN DISPOSABLE) ×2
KIT BASIN OR (CUSTOM PROCEDURE TRAY) ×2 IMPLANT
KIT TURNOVER KIT B (KITS) ×2 IMPLANT
NDL SPNL 22GX3.5 QUINCKE BK (NEEDLE) IMPLANT
NEEDLE HYPO 22GX1.5 SAFETY (NEEDLE) ×2 IMPLANT
NEEDLE SPNL 22GX3.5 QUINCKE BK (NEEDLE) ×2 IMPLANT
NS IRRIG 1000ML POUR BTL (IV SOLUTION) ×2 IMPLANT
OIL CARTRIDGE MAESTRO DRILL (MISCELLANEOUS) ×2
PACK LAMINECTOMY NEURO (CUSTOM PROCEDURE TRAY) ×2 IMPLANT
PAD ARMBOARD 7.5X6 YLW CONV (MISCELLANEOUS) ×6 IMPLANT
RUBBERBAND STERILE (MISCELLANEOUS) ×4 IMPLANT
SPONGE SURGIFOAM ABS GEL SZ50 (HEMOSTASIS) ×2 IMPLANT
STRIP CLOSURE SKIN 1/2X4 (GAUZE/BANDAGES/DRESSINGS) ×2 IMPLANT
SUT VIC AB 2-0 CT1 18 (SUTURE) ×3 IMPLANT
SUT VIC AB 3-0 SH 8-18 (SUTURE) ×2 IMPLANT
TOWEL GREEN STERILE (TOWEL DISPOSABLE) ×2 IMPLANT
TOWEL GREEN STERILE FF (TOWEL DISPOSABLE) ×2 IMPLANT
WATER STERILE IRR 1000ML POUR (IV SOLUTION) ×2 IMPLANT

## 2018-10-05 NOTE — H&P (Signed)
Shannon Garner is an 68 y.o. female.   Chief Complaint: Back and right leg pain HPI: 68 year old female with severe back and right lower extremity radicular pain paresthesias and weakness failing conservative management.  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  Patient presents now for lumbar laminotomy and microdiscectomy in hopes of improving her symptoms.  Past Medical History:  Diagnosis Date  . Acquired cyst of kidney    Cysts on outside & inside of kidneys  . Allergy   . Anxiety   . Arthritis   . Bronchitis, not specified as acute or chronic   . Cancer ([place])    skin cancer  . Cellulitis and abscess of buttock   . Cervicalgia   . Depression   . Diabetes mellitus   . Hyperlipidemia   . Hypertension   . Lumbago   . Migraine without aura, without mention of intractable migraine without mention of status migrainosus   . Osteoarthrosis, unspecified whether generalized or localized, unspecified site   . Pain in joint, lower leg   . Pain in joint, shoulder region   . Proteinuria     Past Surgical History:  Procedure Laterality Date  . ABDOMINAL HYSTERECTOMY    . Bone Spurs Removed Left    Left Heel  . CARPAL TUNNEL RELEASE     rt/lt  . CERVICAL FUSION  2009  . COLONOSCOPY    . Corrective rectal surgery    . CYSTECTOMY     On vertebrae. Rods put in as well  . EYE SURGERY     lt cataract  . HERNIA REPAIR  12/02/2011  . KIDNEY SURGERY     cysts on right kidney  . SPINE SURGERY  2011   cysts on spine  . TOTAL KNEE ARTHROPLASTY     Right knee  . TUBAL LIGATION    . UMBILICAL HERNIA REPAIR  12/02/2011   Procedure: HERNIA REPAIR UMBILICAL ADULT;  Surgeon: Imogene Burn. Georgette Dover, MD;  Location: [place];  Service: General;  Laterality: N/A;  Umbilical hernia repair with mesh    Family History  Problem Relation Age of Onset  . Heart disease Mother        heart attack  . Cancer Father        colon  and liver  . Breast cancer Neg Hx    Social History:  reports that she has been smoking. She has been smoking about 0.50 packs per day. She has never used smokeless tobacco. She reports current alcohol use. She reports that she does not use drugs.  Allergies:  Allergies  Allergen Reactions  . Keflex [Cephalexin]     UNSPECIFIED REACTION     Medications Prior to Admission  Medication Sig Dispense Refill  . aspirin 81 MG tablet Take 81 mg by mouth daily.    Marland Kitchen atorvastatin (LIPITOR) 40 MG tablet Take 40 mg by mouth daily.    . cyclobenzaprine (FLEXERIL) 10 MG tablet Take 10 mg by mouth 3 (three) times daily as needed for muscle spasms.     Marland Kitchen FLUoxetine (PROZAC) 20 MG capsule Take 20 mg by mouth daily.    . hydrochlorothiazide (HYDRODIURIL) 25 MG tablet Take 25 mg by mouth daily.    Marland Kitchen HYDROcodone-acetaminophen (NORCO/VICODIN) 5-325 MG tablet Take 1 tablet by mouth every 6 (six) hours as needed for moderate pain.    . hyoscyamine (LEVSINEX) 0.375 MG 12 hr capsule Take 0.375 mg by mouth 2 (two)  times daily as needed for cramping.     Marland Kitchen lisinopril (PRINIVIL,ZESTRIL) 20 MG tablet Take 20 mg by mouth daily.    . meloxicam (MOBIC) 15 MG tablet Take 15 mg by mouth daily.    . metFORMIN (GLUCOPHAGE-XR) 750 MG 24 hr tablet Take 750 mg by mouth daily with breakfast.    . metoprolol succinate (TOPROL-XL) 50 MG 24 hr tablet Take 50 mg by mouth daily. Take with or immediately following a meal.    . temazepam (RESTORIL) 15 MG capsule Take 15 mg by mouth at bedtime as needed for sleep.     . varenicline (CHANTIX) 1 MG tablet Take 1 mg by mouth 2 (two) times daily.    Marland Kitchen albuterol-ipratropium (COMBIVENT) 18-103 MCG/ACT inhaler Inhale 2 puffs into the lungs every 6 (six) hours as needed for wheezing or shortness of breath.     . cholecalciferol (VITAMIN D3) 25 MCG (1000 UT) tablet Take 1,000 Units by mouth daily.    . Lite Touch Lancets MISC     . ONE TOUCH ULTRA TEST test strip     . promethazine  (PHENERGAN) 25 MG tablet Take 25 mg by mouth every 6 (six) hours as needed for nausea or vomiting.     . vitamin B-12 (CYANOCOBALAMIN) 1000 MCG tablet Take 1,000 mcg by mouth daily.      Results for orders placed or performed during the hospital encounter of 10/05/18 (from the past 48 hour(s))  Glucose, capillary     Status: Abnormal   Collection Time: 10/05/18  6:21 AM  Result Value Ref Range   Glucose-Capillary 111 (H) 70 - 99 mg/dL   No results found.  Pertinent items noted in HPI and remainder of comprehensive ROS otherwise negative.  Blood pressure 138/76, pulse 70, temperature 98.5 F (36.9 C), temperature source Oral, resp. rate 18, height 5\' 3"  (1.6 m), weight 78.9 kg, SpO2 98 %.  Patient is awake and alert.  She is oriented and appropriate.  Speech is fluent.  Judgment insight are intact.  Cranial nerve function normal bilateral.  Motor examination extremities reveals weakness of hip flexion and some mild weakness of knee extension on the right side.  Remainder of her motor strength intact.  Sensory examination with decreased sensation pinprick and light touch in her right L3-L4 and to a lesser degree L5 radicular pattern.  Patient with diminished right patellar reflex.  Achilles reflexes are absent bilaterally.  Otherwise reflexes are normal.  No evidence of long track signs.  Examination head ears eyes nose throat is unremarked.  Chest and abdomen are benign.  Extremities are free from injury or deformity. 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  Plan right L2-3 laminotomy and microdiscectomy.  Risks and benefits of been explained.  Patient wishes to proceed.  [name] 10/05/2018, 7:43 AM

## 2018-10-05 NOTE — Brief Op Note (Signed)
10/05/2018  9:34 AM  PATIENT:  Missy Sabins  68 y.o. female  PRE-OPERATIVE DIAGNOSIS:  HNP  POST-OPERATIVE DIAGNOSIS:  HNP  PROCEDURE:  Procedure(s) with comments: Microdiscectomy - right - L2-L3 (Right) - Microdiscectomy - right - L2-L3  SURGEON:  Surgeon(s) and Role:    Earnie Larsson, MD - Primary  PHYSICIAN ASSISTANT:   ASSISTANTSMearl Latin   ANESTHESIA:   general  EBL:  100 mL   BLOOD ADMINISTERED:none  DRAINS: none   LOCAL MEDICATIONS USED:  MARCAINE     SPECIMEN:  No Specimen  DISPOSITION OF SPECIMEN:  N/A  COUNTS:  YES  TOURNIQUET:  * No tourniquets in log *  DICTATION: .Dragon Dictation  PLAN OF CARE: Admit for overnight observation  PATIENT DISPOSITION:  PACU - hemodynamically stable.   Delay start of Pharmacological VTE agent (>24hrs) due to surgical blood loss or risk of bleeding: yes

## 2018-10-05 NOTE — Transfer of Care (Signed)
Immediate Anesthesia Transfer of Care Note  Patient: Missy Sabins  Procedure(s) Performed: Microdiscectomy - right - L2-L3 (Right Back)  Patient Location: PACU  Anesthesia Type:General  Level of Consciousness: awake, alert  and oriented  Airway & Oxygen Therapy: Patient Spontanous Breathing and Patient connected to face mask oxygen  Post-op Assessment: Report given to RN and Post -op Vital signs reviewed and stable  Post vital signs: Reviewed and stable  Last Vitals:  Vitals Value Taken Time  BP    Temp    Pulse 76 10/05/2018  9:44 AM  Resp 20 10/05/2018  9:44 AM  SpO2 100 % 10/05/2018  9:44 AM  Vitals shown include unvalidated device data.  Last Pain:  Vitals:   10/05/18 0618  TempSrc: Oral         Complications: No apparent anesthesia complications

## 2018-10-05 NOTE — Evaluation (Signed)
Occupational Therapy Evaluation Patient Details Name: Shannon Garner MRN: [id number] DOB: September 24, 1950 Today's Date: 10/05/2018    History of Present Illness Right L2-3 laminotomy and microdiscectomy   Clinical Impression   This 68 yo female admitted and underwent above presents to acute OT with decreased balance, increased pain, and back precautions all affecting her PLOF of basic ADLs. She will benefit from one more session of OT prior to D/C.     Follow Up Recommendations  No OT follow up;Supervision - Intermittent    Equipment Recommendations  None recommended by OT       Precautions / Restrictions Precautions Precautions: Back Precaution Booklet Issued: Yes (comment) Restrictions Weight Bearing Restrictions: No      Mobility Bed Mobility Overal bed mobility: Needs Assistance Bed Mobility: Rolling;Sidelying to Sit Rolling: Supervision Sidelying to sit: Supervision       General bed mobility comments: VCs for technique. Educated on use of pillow under knees when supine and between knees when laying on side  Transfers Overall transfer level: Needs assistance Equipment used: Rolling walker (2 wheeled) Transfers: Sit to/from Stand Sit to Stand: Supervision         General transfer comment: Pt went up and down steps with step-to pattern with min A (1st one her leg gave a little so had her use hand rails after that--family can A her in and out of house        ADL either performed or assessed with clinical judgement   ADL Overall ADL's : Needs assistance/impaired Eating/Feeding: Independent;Sitting   Grooming: Supervision/safety;Set up;Standing   Upper Body Bathing: Supervision/ safety;Set up;Sitting   Lower Body Bathing: Supervison/ safety;Set up;Sit to/from stand   Upper Body Dressing : Supervision/safety;Set up;Sitting   Lower Body Dressing: Set up;Supervision/safety;Sit to/from stand   Toilet Transfer: Min guard;Ambulation;RW;Regular Toilet;Grab  bars   Toileting- Clothing Manipulation and Hygiene: Min guard;Sit to/from stand   Tub/ Shower Transfer: Minimal assistance;Ambulation;Rolling walker;Shower seat;Tub transfer     General ADL Comments: Pt educated on use of 2 cups for brushing teeth, use of wet wipes for back peri care, and not to sit for more than 20-30 minutes at a time     Vision Patient Visual Report: No change from baseline              Pertinent Vitals/Pain Pain Assessment: 0-10 Pain Score: 3  Pain Location: incisional Pain Descriptors / Indicators: Sore Pain Intervention(s): Limited activity within patient's tolerance;Monitored during session     Hand Dominance Right   Extremity/Trunk Assessment Upper Extremity Assessment Upper Extremity Assessment: Overall WFL for tasks assessed           Communication Communication Communication: No difficulties   Cognition Arousal/Alertness: Awake/alert Behavior During Therapy: WFL for tasks assessed/performed Overall Cognitive Status: Within Functional Limits for tasks assessed                                                Home Living Family/patient expects to be discharged to:: Private residence Living Arrangements: Alone Available Help at Discharge: Family;Available PRN/intermittently Type of Home: House Home Access: Stairs to enter [employer] of Steps: 2 Entrance Stairs-Rails: None Home Layout: One level     Bathroom Shower/Tub: Tub/shower unit;Curtain   Bathroom Toilet: Handicapped height     Home Equipment: Environmental consultant - 2 wheels;Cane - single point;Bedside commode;Shower seat  Prior Functioning/Environment Level of Independence: Independent                 OT Problem List: Decreased strength;Decreased range of motion;Impaired balance (sitting and/or standing);Pain      OT Treatment/Interventions: Self-care/ADL training;Balance training;DME and/or AE instruction;Patient/family  education;Other (comment)(stair training)    OT Goals(Current goals can be found in the care plan section) Acute Rehab OT Goals Patient Stated Goal: home tomorrow OT Goal Formulation: With patient Time For Goal Achievement: 10/12/18 Potential to Achieve Goals: Good  OT Frequency: Min 2X/week              AM-PAC OT "6 Clicks" Daily Activity     Outcome Measure Help from another person eating meals?: None Help from another person taking care of personal grooming?: A Little Help from another person toileting, which includes using toliet, bedpan, or urinal?: A Little Help from another person bathing (including washing, rinsing, drying)?: A Little Help from another person to put on and taking off regular upper body clothing?: A Little Help from another person to put on and taking off regular lower body clothing?: A Little 6 Click Score: 19   End of Session Equipment Utilized During Treatment: Gait belt;Rolling walker Nurse Communication: Mobility status(NT as well)  Activity Tolerance: Patient tolerated treatment well Patient left: in chair;with call bell/phone within reach  OT Visit Diagnosis: Unsteadiness on feet (R26.81);Pain Pain - part of body: (incisional)                Time: 6948-5462 OT Time Calculation (min): 42 min Charges:  OT General Charges $OT Visit: 1 Visit OT Evaluation $OT Eval Moderate Complexity: 1 Mod OT Treatments $Self Care/Home Management : 23-37 mins  Golden Circle, OTR/L Acute [employer] Pager [phone] Office [phone]     Almon Register 10/05/2018, 4:21 PM

## 2018-10-05 NOTE — Anesthesia Postprocedure Evaluation (Signed)
Anesthesia Post Note  Patient: Shannon Garner  Procedure(s) Performed: Microdiscectomy - right - L2-L3 (Right Back)     Patient location during evaluation: Phase II Anesthesia Type: General Level of consciousness: awake Pain management: pain level controlled Vital Signs Assessment: post-procedure vital signs reviewed and stable Respiratory status: spontaneous breathing Cardiovascular status: stable Postop Assessment: no apparent nausea or vomiting Anesthetic complications: no    Last Vitals:  Vitals:   10/05/18 1028 10/05/18 1052  BP: 133/81 140/67  Pulse: 67 68  Resp: 18 14  Temp: 36.7 C 36.7 C  SpO2: 97% 98%    Last Pain:  Vitals:   10/05/18 1306  TempSrc:   PainSc: 4                  Shaterrica Territo

## 2018-10-05 NOTE — Anesthesia Procedure Notes (Signed)
Procedure Name: Intubation Date/Time: 10/05/2018 8:10 AM Performed by: Candis Shine, CRNA Pre-anesthesia Checklist: Patient identified, Emergency Drugs available, Suction available and Patient being monitored Patient Re-evaluated:Patient Re-evaluated prior to induction Oxygen Delivery Method: Circle System Utilized Preoxygenation: Pre-oxygenation with 100% oxygen Induction Type: IV induction and Rapid sequence Laryngoscope Size: Mac and 3 Grade View: Grade I Tube type: Oral Tube size: 7.0 mm Number of attempts: 1 Airway Equipment and Method: Stylet Placement Confirmation: ETT inserted through vocal cords under direct vision,  positive ETCO2 and breath sounds checked- equal and bilateral Secured at: 22 cm Tube secured with: Tape Dental Injury: Teeth and Oropharynx as per pre-operative assessment

## 2018-10-05 NOTE — Op Note (Signed)
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  Asst. Surgeon: Reinaldo Meeker, NP  Anesthesia: General  Indication: 68 year old female with severe back and right lower extremity pain paresthesias and weakness.  Work-up demonstrates evidence for large right-sided L2-3 disc herniation with severe stenosis.  Patient presents now for laminotomy and microdiscectomy in hopes of improving her symptoms.  Operative note: After induction anesthesia, patient position prone on the Wilson frame properly padded.  Lumbar region prepped and draped sterilely.  Incision made overlying L2-3.  Dissection performed on the right.  Retractor placed.  X-ray taken.  Level confirmed.  Laminotomy performed using high-speed drill and Kerrison Rogers.  Ligament flavum elevated and resected.  Underlying thecal sac and right L3 nerve root identified.  Microscope brought in field is microdissection of the spinal canal.  Thecal sac and L3 nerve root gently mobilized track towards midline.  Disc space and disc herniation readily apparent.  The overriding ligament/annulus was incised.  Large amount of fragmented disc material was then removed and numerous pieces.  The disc space was evacuated from both above and below the disc space.  The disc base was entered.  An aggressive intradiscal discectomy was performed.  At this point there was no evidence of any continued compression.  Wound is then irrigated fanlike solution.  Gelfoam was placed topically for hemostasis.  Wound is then closed in layers with Vicryl sutures.  Steri-Strips and sterile dressing were applied.  There were no apparent complications.  Patient tolerated the procedure well and she returns to the recovery room postop.

## 2018-10-05 NOTE — Anesthesia Postprocedure Evaluation (Signed)
Anesthesia Post Note  Patient: Shannon Garner  Procedure(s) Performed: Microdiscectomy - right - L2-L3 (Right Back)     Patient location during evaluation: PACU Anesthesia Type: General Level of consciousness: awake Pain management: pain level controlled Vital Signs Assessment: post-procedure vital signs reviewed and stable Respiratory status: spontaneous breathing Cardiovascular status: stable Postop Assessment: no apparent nausea or vomiting Anesthetic complications: no    Last Vitals:  Vitals:   10/05/18 1028 10/05/18 1052  BP: 133/81 140/67  Pulse: 67 68  Resp: 18 14  Temp: 36.7 C 36.7 C  SpO2: 97% 98%    Last Pain:  Vitals:   10/05/18 1052  TempSrc:   PainSc: 2                  Meade Hogeland

## 2018-10-06 ENCOUNTER — Encounter (HOSPITAL_COMMUNITY): Payer: Self-pay | Admitting: Neurosurgery

## 2018-10-06 DIAGNOSIS — M5116 Intervertebral disc disorders with radiculopathy, lumbar region: Secondary | ICD-10-CM | POA: Diagnosis not present

## 2018-10-06 LAB — GLUCOSE, CAPILLARY: Glucose-Capillary: 160 mg/dL — ABNORMAL HIGH (ref 70–99)

## 2018-10-06 MED ORDER — HYDROCODONE-ACETAMINOPHEN 5-325 MG PO TABS: 1.0000 | ORAL_TABLET | ORAL | 0 refills | Status: DC | PRN

## 2018-10-06 MED ORDER — CYCLOBENZAPRINE HCL 10 MG PO TABS: 10.0000 mg | ORAL_TABLET | Freq: Three times a day (TID) | ORAL | 0 refills | Status: AC | PRN

## 2018-10-06 MED FILL — CYCLOBENZAPRINE 10 MG TABLE: 10 | 10 days supply | Qty: 30 | Fill #0

## 2018-10-06 MED FILL — HYDROCODON-APAP 5-325: 5-325 | 7 days supply | Qty: 42 | Fill #0

## 2018-10-06 NOTE — Discharge Summary (Signed)
Physician Discharge Summary     Providing Compassionate, Quality Care - Together  Patient ID: Shannon Garner MRN: [id number] DOB/AGE: [birth year] 69 y.o.  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 Patient underwent an L2-3 right-sided microdiscectomy on 0/35/00 without complication. She worked with occupational therapy later the same day. Her pain is much improved. She is ready to discharge home.  Consults: rehabilitation medicine  Significant Diagnostic Studies: radiology: Dg Lumbar Spine 2-3 Views  Result Date: 10/05/2018 CLINICAL DATA:  RIGHT L2-3 microdiskectomy due to a large disc extrusion and free fragment. EXAM: Operative LUMBAR SPINE - 2-3 VIEW COMPARISON:  MRI lumbar spine 08/12/2018. Lumbar spine x-rays 05/04/2018. FINDINGS: The previous examinations demonstrated 5 non-rib-bearing lumbar vertebrae. These images are submitted for interpretation postoperatively. Image # 1 obtained at 8:25 a.m. demonstrates the localizer needle adjacent to the spinous process of L1, directed toward L1-2. Image # 2 obtained at 8:33 a.m. demonstrates tissue spreaders with the localizer device directed toward the L2-3 disc space. Note is again made of the grade 1 spondylolisthesis of L4 on L5. IMPRESSION: L2-3 localized intraoperatively. Electronically Signed   By: Evangeline Dakin M.D.   On: 10/05/2018 09:10    Treatments: surgery: Right L2-3 laminotomy and microdiscectomy  Discharge Exam: Blood pressure 121/70, pulse 65, temperature (!) 97.4 F (36.3 C), temperature source Oral, resp. rate 17, height 5\' 3"  (1.6 m), weight 78.9 kg, SpO2 98 %.  Alert and  oriented x 4 MAE; Strength 5/5 BUE, BLE CN II-XII grossly intact Sensation intact Incision clean, dry, and intact; honeycomb and steri strips in place  Disposition: Discharge disposition: 01-Home or Self Care        Allergies as of 10/06/2018      Reactions   Keflex [cephalexin]    UNSPECIFIED REACTION       Medication List    TAKE these medications   albuterol-ipratropium 18-103 MCG/ACT inhaler Commonly known as:  COMBIVENT Inhale 2 puffs into the lungs every 6 (six) hours as needed for wheezing or shortness of breath.   aspirin 81 MG tablet Take 81 mg by mouth daily.   atorvastatin 40 MG tablet Commonly known as:  LIPITOR Take 40 mg by mouth daily.   cholecalciferol 25 MCG (1000 UT) tablet Commonly known as:  VITAMIN D3 Take 1,000 Units by mouth daily.   cyclobenzaprine 10 MG tablet Commonly known as:  FLEXERIL Take 1 tablet (10 mg total) by mouth 3 (three) times daily as needed for muscle spasms.   FLUoxetine 20 MG capsule Commonly known as:  PROZAC Take 20 mg by mouth daily.   hydrochlorothiazide 25 MG tablet Commonly known as:  HYDRODIURIL Take 25 mg by mouth daily.   HYDROcodone-acetaminophen 5-325 MG tablet Commonly known as:  NORCO/VICODIN Take 1 tablet by mouth every 4 (four) hours as needed for moderate pain. What changed:  when to take this   hyoscyamine 0.375 MG 12 hr capsule Commonly known as:  LEVSINEX Take 0.375 mg by mouth 2 (two) times daily as needed for cramping.   lisinopril 20 MG tablet Commonly known as:  ZESTRIL Take 20 mg by mouth daily.   Lite Touch Lancets Misc   meloxicam 15 MG tablet Commonly known as:  MOBIC Take 15  mg by mouth daily.   metFORMIN 750 MG 24 hr tablet Commonly known as:  GLUCOPHAGE-XR Take 750 mg by mouth daily with breakfast.   metoprolol succinate 50 MG 24 hr tablet Commonly known as:  TOPROL-XL Take 50 mg by mouth daily. Take with or immediately following a meal.   ONE TOUCH ULTRA TEST test  strip Generic drug:  glucose blood   promethazine 25 MG tablet Commonly known as:  PHENERGAN Take 25 mg by mouth every 6 (six) hours as needed for nausea or vomiting.   temazepam 15 MG capsule Commonly known as:  RESTORIL Take 15 mg by mouth at bedtime as needed for sleep.   varenicline 1 MG tablet Commonly known as:  CHANTIX Take 1 mg by mouth 2 (two) times daily.   vitamin B-12 1000 MCG tablet Commonly known as:  CYANOCOBALAMIN Take 1,000 mcg by mouth daily.        Signed: Viona Gilmore, DNP, AGNP-C Nurse Practitioner  [facility] [place] [address], [place], [place] [address] P: [phone]    F: [phone]  10/06/2018 10:14 AM

## 2018-10-06 NOTE — Progress Notes (Signed)
Occupational Therapy Treatment Patient Details Name: Shannon Garner MRN: [id number] DOB: [birth year] Today's Date: 10/06/2018    History of present illness Right L2-3 laminotomy and microdiscectomy   OT comments  Pt seen for OT today - pt able to complete 3 stairs and tub tranfers to shower seat with mod I today.  Pt also able to recall back precautions independently. Pt states she has shower seat and grab bars at home.  Son can assist as needed. Pt also states she has ADL equipment at home. Pt denies any concerns or questions at this time. Pt to d/c home today per MD and pt   Follow Up Recommendations  No OT follow up;Supervision - Intermittent    Equipment Recommendations       Recommendations for Other Services      Precautions / Restrictions Precautions Precautions: Back Precaution Booklet Issued: Yes (comment) Restrictions Weight Bearing Restrictions: No(Pt is limited to picking up no more than 5 pounds)       Mobility Bed Mobility Overal bed mobility: Modified Independent Bed Mobility: Rolling;Sidelying to Sit;Sit to Sidelying Rolling: Modified independent (Device/Increase time) Sidelying to sit: Modified independent (Device/Increase time)     Sit to sidelying: Modified independent (Device/Increase time) General bed mobility comments: pt able to independently use pillow for positioning  Transfers Overall transfer level: Modified independent Equipment used: Rolling walker (2 wheeled) Transfers: Sit to/from Stand Sit to Stand: Modified independent (Device/Increase time)         General transfer comment: Pt able to complete stairs and tub transfer mod I . Pt states she only has 2 steps to enter home.      Balance Overall balance assessment: Modified Independent                                         ADL either performed or assessed with clinical judgement   ADL Overall ADL's : Needs assistance/impaired Eating/Feeding:  Independent;Sitting   Grooming: Independent;Standing   Upper Body Bathing: Sitting;Modified independent Upper Body Bathing Details (indicate cue type and reason): recommend supervision for shower transfers initially at home pt verbalized understanding Lower Body Bathing: Sit to/from stand;Modified independent   Upper Body Dressing : Modified independent   Lower Body Dressing: Modified independent;Sit to/from stand(per pt, pt has ADL equipment at home)   Toilet Transfer: Modified Independent;Ambulation;Regular Toilet;Grab bars   Toileting- Clothing Manipulation and Hygiene: Modified independent;Sit to/from stand   Tub/ Banker: Modified Theatre stage manager Details (indicate cue type and reason): mod I here in clinic however did recommend supervision for initial shower at home - pt in agreement  Functional mobility during ADLs: Modified independent General ADL Comments: Pt at mod I level today for all activities      Vision       Perception     Praxis      Cognition Arousal/Alertness: Awake/alert Behavior During Therapy: WFL for tasks assessed/performed Overall Cognitive Status: Within Functional Limits for tasks assessed                                          Exercises     Shoulder Instructions       General Comments Pt uses RW and demonstrates good balance and good safety awareness for functional mobility and self care activities  Pertinent Vitals/ Pain       Pain Score: 1  Pain Location: incisional Pain Descriptors / Indicators: Sore Pain Intervention(s): Monitored during session  Home Living                                          Prior Functioning/Environment              Frequency  Min 2X/week        Progress Toward Goals  OT Goals(current goals can now be found in the care plan section)  Progress towards OT goals: Progressing toward goals  Acute Rehab OT Goals Patient Stated Goal:  pt has met goals and will d/c home today OT Goal Formulation: With patient Time For Goal Achievement: 10/12/18 Potential to Achieve Goals: Good  Plan      Co-evaluation                 AM-PAC OT "6 Clicks" Daily Activity     Outcome Measure   Help from another person eating meals?: None Help from another person taking care of personal grooming?: None Help from another person toileting, which includes using toliet, bedpan, or urinal?: None Help from another person bathing (including washing, rinsing, drying)?: None Help from another person to put on and taking off regular upper body clothing?: None Help from another person to put on and taking off regular lower body clothing?: None 6 Click Score: 24    End of Session Equipment Utilized During Treatment: Rolling walker  OT Visit Diagnosis: Unsteadiness on feet (R26.81);Pain   Activity Tolerance Patient tolerated treatment well   Patient Left in bed;with call bell/phone within reach;with bed alarm set   Nurse Communication          Time: 7711-6579 OT Time Calculation (min): 17 min  Charges: OT General Charges $OT Visit: 1 Visit OT Treatments $Self Care/Home Management : 8-22 mins     Quay Burow, OTR/L 10/06/2018, 11:08 AM

## 2018-10-06 NOTE — TOC Initial Note (Signed)
Transition of Care [facility]) - Initial/Assessment Note    Patient Details  Name: Shannon Garner MRN: [id number] Date of Birth: 1950/11/29  [facility] of [facility]) CM/SW Contact:    Pollie Friar, RN Phone Number: 10/06/2018, 11:03 AM  Clinical Narrative:                   Expected Discharge Plan: Home/Self Care Barriers to Discharge: No Barriers Identified   Patient Goals and CMS Choice        Expected Discharge Plan and Services Expected Discharge Plan: Home/Self Care         Expected Discharge Date: 10/06/18                        Prior Living Arrangements/Services   Lives with:: Self Patient language and need for interpreter reviewed:: Yes(no needs) Do you feel safe going back to the place where you live?: Yes      Need for Family Participation in Patient Care: Yes (Comment)(intermittent) Care giver support system in place?: Yes (comment)(son and daughter in law able to provide intermittent supervision.) Current home services: DME(cane, walker, 3 in 1) Criminal Activity/Legal Involvement Pertinent to Current Situation/Hospitalization: No - Comment as needed  Activities of Daily Living Home Assistive Devices/Equipment: None ADL Screening (condition at time of admission) Patient's cognitive ability adequate to safely complete daily activities?: Yes Is the patient deaf or have difficulty hearing?: No Does the patient have difficulty seeing, even when wearing glasses/contacts?: No Does the patient have difficulty concentrating, remembering, or making decisions?: No Patient able to express need for assistance with ADLs?: Yes Does the patient have difficulty dressing or bathing?: No Independently performs ADLs?: Yes (appropriate for developmental age) Does the patient have difficulty walking or climbing stairs?: No Weakness of Legs: None Weakness of Arms/Hands: None  Permission Sought/Granted                  Emotional Assessment Appearance:: Appears  stated age Attitude/Demeanor/Rapport: Engaged Affect (typically observed): Accepting, Pleasant, Appropriate Orientation: : Oriented to Self, Oriented to Place, Oriented to  Time, Oriented to Situation   Psych Involvement: No (comment)  Admission diagnosis:  HNP Patient Active Problem List   Diagnosis Date Noted  . Lumbar radiculopathy 10/05/2018  . Umbilical hernia 10/62/6948  . Abdominal pain, lower 10/28/2011   PCP:  Aletha Halim., PA-C Pharmacy:   [facility] [place] [address] Phone: [phone] Fax: [place], [place] - [address] [place] [address] Phone: [phone] Fax: [phone]     Social Determinants of Health (SDOH) Interventions    Readmission Risk Interventions No flowsheet data found.

## 2018-10-06 NOTE — Care Management Obs Status (Signed)
[facility] NOTIFICATION   Patient Details  Name: Shannon Garner MRN: [id number] Date of Birth: 09-23-1950   Medicare Observation Status Notification Given:  Yes    Pollie Friar, RN 10/06/2018, 10:41 AM

## 2018-10-06 NOTE — TOC Transition Note (Signed)
Transition of Care [facility]) - CM/SW Discharge Note   Patient Details  Name: Shannon Garner MRN: [id number] Date of Birth: May 14, 1951  [facility]) CM/SW Contact:  Pollie Friar, RN Phone Number: 10/06/2018, 11:03 AM   Clinical Narrative:    Family to provide transport home. Bedside RN updated.   Final next level of care: Home/Self Care Barriers to Discharge: No Barriers Identified   Patient Goals and CMS Choice        Discharge Placement                       Discharge Plan and Services                          Social Determinants of Health (SDOH) Interventions     Readmission Risk Interventions No flowsheet data found.

## 2018-10-06 NOTE — Progress Notes (Signed)
Patient was given discharge information, went over details and patient verbalized understanding and had no questions at this time.  IV was removed per Nurse Tech, no complications.

## 2019-07-08 ENCOUNTER — Ambulatory Visit: Payer: Medicare PPO | Attending: Internal Medicine

## 2019-07-08 DIAGNOSIS — Z23 Encounter for immunization: Secondary | ICD-10-CM

## 2019-07-08 NOTE — Progress Notes (Signed)
   Covid-19 Vaccination Clinic  Name:  Shannon Garner    MRN: [id number] DOB: 15-May-1951  07/08/2019  [name] was observed post Covid-19 immunization for 15 minutes without incidence. She was provided with Vaccine Information Sheet and instruction to access the V-Safe system.   [name] was instructed to call 911 with any severe reactions post vaccine: Marland Kitchen Difficulty breathing  . Swelling of your face and throat  . A fast heartbeat  . A bad rash all over your body  . Dizziness and weakness    Immunizations Administered    Name Date Dose VIS Date Route   Pfizer COVID-19 Vaccine 07/08/2019 10:26 AM 0.3 mL 05/27/2019 Intramuscular   Manufacturer: [employer]   Lot: [id number]   [place]: [id number]

## 2019-07-29 ENCOUNTER — Ambulatory Visit: Payer: Medicare PPO | Attending: Internal Medicine

## 2019-07-29 DIAGNOSIS — Z23 Encounter for immunization: Secondary | ICD-10-CM | POA: Insufficient documentation

## 2019-07-29 NOTE — Progress Notes (Signed)
   Covid-19 Vaccination Clinic  Name:  Shannon Garner    MRN: [id number] DOB: 11/06/1950  07/29/2019  Shannon Garner was observed post Covid-19 immunization for 15 minutes without incidence. She was provided with Vaccine Information Sheet and instruction to access the V-Safe system.   Shannon Garner was instructed to call 911 with any severe reactions post vaccine: Marland Kitchen Difficulty breathing  . Swelling of your face and throat  . A fast heartbeat  . A bad rash all over your body  . Dizziness and weakness    Immunizations Administered    Name Date Dose VIS Date Route   Pfizer COVID-19 Vaccine 07/29/2019 11:17 AM 0.3 mL 05/27/2019 Intramuscular   Manufacturer: [place]   Lot: [id number]   [place]: [id number]

## 2019-08-09 ENCOUNTER — Other Ambulatory Visit: Payer: Self-pay | Admitting: *Deleted

## 2019-08-09 ENCOUNTER — Encounter: Payer: Self-pay | Admitting: *Deleted

## 2019-08-10 ENCOUNTER — Encounter: Payer: Self-pay | Admitting: Diagnostic Neuroimaging

## 2019-08-10 ENCOUNTER — Other Ambulatory Visit: Payer: Self-pay

## 2019-08-10 ENCOUNTER — Ambulatory Visit: Payer: Medicare PPO | Admitting: Diagnostic Neuroimaging

## 2019-08-10 VITALS — BP 119/71 | HR 70 | Temp 97.7°F | Ht 63.0 in | Wt 158.0 lb

## 2019-08-10 DIAGNOSIS — R413 Other amnesia: Secondary | ICD-10-CM | POA: Diagnosis not present

## 2019-08-10 NOTE — Progress Notes (Signed)
[facility]  PATIENT: Shannon Garner DOB: Feb 23, 1951  REFERRING CLINICIAN: Aletha Halim., PA-C HISTORY FROM: patient  REASON FOR VISIT: new consult    HISTORICAL  CHIEF COMPLAINT:  Chief Complaint  Patient presents with  . Memory Loss    rm 7 New Pt, best friendBerenice Primas  MMSE 25    HISTORY OF PRESENT ILLNESS:   69 year old female here for evaluation memory loss.  Patient describes mild short-term memory loss problems since 2020.  She has had a stressful year with COVID-19 pandemic, back surgery in 26-Oct-2018, and death of her husband in 07/27/18 who had dementia.  Patient was primary caregiver for him.  Patient also has long history of depression which has significantly worsened in 2020.  Otherwise patient is able to maintain her ADLs, personal hygiene, shopping, chores, finances, medications and appointments.   REVIEW OF SYSTEMS: Full 14 system review of systems performed and negative with exception of: as per HPI.    ALLERGIES: Allergies  Allergen Reactions  . Keflex [Cephalexin]     UNSPECIFIED REACTION     HOME MEDICATIONS: Outpatient Medications Prior to Visit  Medication Sig Dispense Refill  . aspirin 81 MG tablet Take 81 mg by mouth daily.    Marland Kitchen atorvastatin (LIPITOR) 40 MG tablet Take 40 mg by mouth daily.    Marland Kitchen buPROPion (WELLBUTRIN XL) 150 MG 24 hr tablet Take 2 tablets a day    . cyclobenzaprine (FLEXERIL) 10 MG tablet Take 1 tablet (10 mg total) by mouth 3 (three) times daily as needed for muscle spasms. 30 tablet 0  . donepezil (ARICEPT) 5 MG tablet Take 5 mg by mouth at bedtime.    . fluticasone (FLONASE) 50 MCG/ACT nasal spray Place into the nose.    . hydrochlorothiazide (HYDRODIURIL) 25 MG tablet Take 25 mg by mouth daily.    . hydrochlorothiazide (MICROZIDE) 12.5 MG capsule 12.5 mg daily.    . hyoscyamine (LEVSINEX) 0.375 MG 12 hr capsule Take 0.375 mg by mouth 2 (two) times daily as needed for cramping.     .  Ipratropium-Albuterol (COMBIVENT) 20-100 MCG/ACT AERS respimat Inhale into the lungs.    Marland Kitchen lisinopril (PRINIVIL,ZESTRIL) 20 MG tablet Take 20 mg by mouth daily.    . Lite Touch Lancets MISC     . LORazepam (ATIVAN) 1 MG tablet TAKE 1 TABLET BY MOUTH ONCE DAILY AS NEEDED    . meloxicam (MOBIC) 15 MG tablet Take 15 mg by mouth daily.    . metFORMIN (GLUCOPHAGE-XR) 750 MG 24 hr tablet Take 750 mg by mouth daily with breakfast.    . metoprolol succinate (TOPROL-XL) 50 MG 24 hr tablet Take 50 mg by mouth daily. Take with or immediately following a meal.    . ONE TOUCH ULTRA TEST test strip     . promethazine (PHENERGAN) 25 MG tablet Take 25 mg by mouth every 6 (six) hours as needed for nausea or vomiting.     . traZODone (DESYREL) 50 MG tablet Take one pill at night    . metoprolol tartrate (LOPRESSOR) 50 MG tablet 50 mg daily.    . cholecalciferol (VITAMIN D3) 25 MCG (1000 UT) tablet Take 1,000 Units by mouth daily.    Marland Kitchen FLUoxetine (PROZAC) 20 MG capsule Take 20 mg by mouth daily.    Marland Kitchen HYDROcodone-acetaminophen (NORCO/VICODIN) 5-325 MG tablet Take 1 tablet by mouth every 4 (four) hours as needed for moderate pain. 42 tablet 0  . temazepam (RESTORIL) 15 MG capsule  Take 15 mg by mouth at bedtime as needed for sleep.     . varenicline (CHANTIX) 1 MG tablet Take 1 mg by mouth 2 (two) times daily.    . vitamin B-12 (CYANOCOBALAMIN) 1000 MCG tablet Take 1,000 mcg by mouth daily.     No facility-administered medications prior to visit.    PAST MEDICAL HISTORY: Past Medical History:  Diagnosis Date  . Acquired cyst of kidney    Cysts on outside & inside of kidneys  . Allergy   . Anxiety   . Arthritis   . Asthma   . Bronchitis, not specified as acute or chronic   . Cancer ([place])    skin cancer  . Cellulitis and abscess of buttock   . Cervicalgia   . Depression   . Diabetes mellitus   . Hyperlipidemia   . Hypertension   . Insomnia   . Leg weakness, bilateral   . Lumbago   . Memory  difficulty   . Migraine without aura, without mention of intractable migraine without mention of status migrainosus   . Osteoarthrosis, unspecified whether generalized or localized, unspecified site   . Pain in joint, lower leg   . Pain in joint, shoulder region   . Proteinuria     PAST SURGICAL HISTORY: Past Surgical History:  Procedure Laterality Date  . ABDOMINAL HYSTERECTOMY    . Bone Spurs Removed Left    Left Heel  . CARPAL TUNNEL RELEASE     rt/lt  . CATARACT EXTRACTION    . CERVICAL FUSION  2009  . COLONOSCOPY    . Corrective rectal surgery    . CYSTECTOMY     On vertebrae. Rods put in as well  . EYE SURGERY     lt cataract  . FOOT SURGERY    . HERNIA REPAIR  12/02/2011  . hypercholesterol    . insomnia    . KIDNEY SURGERY     cysts on right kidney  . LUMBAR LAMINECTOMY/DECOMPRESSION MICRODISCECTOMY Right 10/05/2018   Procedure: Microdiscectomy - right - L2-L3;  Surgeon: Earnie Larsson, MD;  Location: [facility];  Service: Neurosurgery;  Laterality: Right;  Microdiscectomy - right - L2-L3  . lumbar radiculopathy    . SPINE SURGERY  2011   cysts on spine  . TOTAL KNEE ARTHROPLASTY     Right knee  . TUBAL LIGATION    . UMBILICAL HERNIA REPAIR  12/02/2011   Procedure: HERNIA REPAIR UMBILICAL ADULT;  Surgeon: Imogene Burn. Georgette Dover, MD;  Location: [facility];  Service: General;  Laterality: N/A;  Umbilical hernia repair with mesh    FAMILY HISTORY: Family History  Problem Relation Age of Onset  . Heart disease Mother        heart attack  . Hypertension Mother   . Cancer Father        colon and liver  . Alcohol abuse Father   . Breast cancer Neg Hx     SOCIAL HISTORY: Social History   Socioeconomic History  . Marital status: Widowed    Spouse name: Not on file  . Number of children: 1  . Years of education: 90  . Highest education level: Not on file  Occupational History    Comment: retired  Tobacco Use  . Smoking status: Former Smoker    Packs/day:  1.00    Quit date: 07/25/2018    Years since quitting: 1.0  . Smokeless tobacco: Never Used  Substance and Sexual Activity  . Alcohol use: Yes  Comment: socially  . Drug use: No  . Sexual activity: Not on file  Other Topics Concern  . Not on file  Social History Narrative   08/10/19 lives alone   Caffeine 1-2 day   Social Determinants of Health   Financial Resource Strain:   . Difficulty of Paying Living Expenses: Not on file  Food Insecurity:   . Worried About Charity fundraiser in the Last Year: Not on file  . Ran Out of Food in the Last Year: Not on file  Transportation Needs:   . Lack of Transportation (Medical): Not on file  . Lack of Transportation (Non-Medical): Not on file  Physical Activity:   . Days of Exercise per Week: Not on file  . Minutes of Exercise per Session: Not on file  Stress:   . Feeling of Stress : Not on file  Social Connections:   . Frequency of Communication with Friends and Family: Not on file  . Frequency of Social Gatherings with Friends and Family: Not on file  . Attends Religious Services: Not on file  . Active Member of Clubs or Organizations: Not on file  . Attends Archivist Meetings: Not on file  . Marital Status: Not on file  Intimate Partner Violence:   . Fear of Current or Ex-Partner: Not on file  . Emotionally Abused: Not on file  . Physically Abused: Not on file  . Sexually Abused: Not on file     PHYSICAL EXAM  GENERAL EXAM/CONSTITUTIONAL: Vitals:  Vitals:   08/10/19 1046  BP: 119/71  Pulse: 70  Temp: 97.7 F (36.5 C)  Weight: 158 lb (71.7 kg)  Height: 5\' 3"  (1.6 m)     Body mass index is 27.99 kg/m. Wt Readings from Last 3 Encounters:  08/10/19 158 lb (71.7 kg)  10/05/18 174 lb (78.9 kg)  09/28/18 175 lb (79.4 kg)     Patient is in no distress; well developed, nourished and groomed; neck is supple  CARDIOVASCULAR:  Examination of carotid arteries is normal; no carotid bruits  Regular rate  and rhythm, no murmurs  Examination of peripheral vascular system by observation and palpation is normal  EYES:  Ophthalmoscopic exam of optic discs and posterior segments is normal; no papilledema or hemorrhages  No exam data present  MUSCULOSKELETAL:  Gait, strength, tone, movements noted in Neurologic exam below  NEUROLOGIC: MENTAL STATUS:  MMSE - Mini Mental State Exam 08/10/2019  Orientation to time 5  Orientation to Place 4  Registration 3  Attention/ Calculation 1  Recall 3  Language- name 2 objects 2  Language- repeat 1  Language- follow 3 step command 3  Language- read & follow direction 1  Write a sentence 1  Copy design 1  Total score 25    awake, alert, oriented to person, place and time  recent and remote memory intact  normal attention and concentration  language fluent, comprehension intact, naming intact  fund of knowledge appropriate  CRANIAL NERVE:   2nd - no papilledema on fundoscopic exam  2nd, 3rd, 4th, 6th - pupils equal and reactive to light, visual fields full to confrontation, extraocular muscles intact, no nystagmus  5th - facial sensation symmetric  7th - facial strength symmetric  8th - hearing intact  9th - palate elevates symmetrically, uvula midline  11th - shoulder shrug symmetric  12th - tongue protrusion midline  MOTOR:   normal bulk and tone, full strength in the BUE, BLE  SENSORY:   normal  and symmetric to light touch, temperature, vibration  COORDINATION:   finger-nose-finger, fine finger movements normal  REFLEXES:   deep tendon reflexes present and symmetric  GAIT/STATION:   narrow based gait     DIAGNOSTIC DATA (LABS, IMAGING, TESTING) - I reviewed patient records, labs, notes, testing and imaging myself where available.  Lab Results  Component Value Date   WBC 9.2 09/28/2018   HGB 14.3 09/28/2018   HCT 42.9 09/28/2018   MCV 93.3 09/28/2018   PLT 181 09/28/2018      Component Value  Date/Time   NA 140 09/28/2018 1026   K 3.8 09/28/2018 1026   CL 103 09/28/2018 1026   CO2 26 09/28/2018 1026   GLUCOSE 102 (H) 09/28/2018 1026   BUN 23 09/28/2018 1026   CREATININE 0.81 09/28/2018 1026   CREATININE 0.85 10/28/2011 1514   CALCIUM 10.0 09/28/2018 1026   GFRNONAA >60 09/28/2018 1026   GFRAA >60 09/28/2018 1026   No results found for: CHOL, HDL, LDLCALC, LDLDIRECT, TRIG, CHOLHDL Lab Results  Component Value Date   HGBA1C 5.9 (H) 09/28/2018   No results found for: VITAMINB12 No results found for: TSH     ASSESSMENT AND PLAN  69 y.o. year old female here with:  Dx:  1. 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 MR BRAIN W WO CONTRAST   Return pending test results, for pending if symptoms worsen or fail to improve.    Penni Bombard, MD [id number], [id number] AM Certified in Neurology, Neurophysiology and Neuroimaging  [facility] [address] [place], [place] [address] [phone]

## 2019-08-10 NOTE — Patient Instructions (Signed)
MEMORY LOSS  (MMSE 25/30) - suspect worsening depression is major cause  - consider addl tx for depression (psychiatry / psychology) if patient agrees  - check MRI brain to rule out other causes

## 2019-08-17 ENCOUNTER — Telehealth: Payer: Self-pay | Admitting: Diagnostic Neuroimaging

## 2019-08-17 NOTE — Telephone Encounter (Signed)
Shannon Garner Shannon Garner: [id number] (exp. 08/17/19 to 09/16/19) order sent to GI. They will reach out to the patient to schedule.

## 2019-09-10 ENCOUNTER — Ambulatory Visit
Admission: RE | Admit: 2019-09-10 | Discharge: 2019-09-10 | Disposition: A | Discharge status: Home / Self Care | Payer: Medicare PPO | Source: Ambulatory Visit | Attending: Diagnostic Neuroimaging | Admitting: Diagnostic Neuroimaging

## 2019-09-10 DIAGNOSIS — R413 Other amnesia: Secondary | ICD-10-CM

## 2019-09-10 MED ORDER — GADOBENATE DIMEGLUMINE 529 MG/ML IV SOLN
14.0000 mL | Freq: Once | INTRAVENOUS | Status: AC | PRN
  Administered 2019-09-10: 14 mL via INTRAVENOUS

## 2019-09-22 ENCOUNTER — Telehealth: Payer: Self-pay | Admitting: Diagnostic Neuroimaging

## 2019-09-22 NOTE — Telephone Encounter (Signed)
Pt called wanting to know the update on her MRI of the Brain results. Please advise.

## 2019-09-27 NOTE — Telephone Encounter (Addendum)
Spoke with patient and informed her that the MRI brain was overall unremarkable imaging results. Dr Leta Baptist recommends to continue current plan of monitoring and calling for any questions. Patient verbalized understanding, appreciation.

## 2020-02-27 IMAGING — CR LUMBAR SPINE - 2-3 VIEW
2 series · 2 of 2 positions shown · non-contrast
Comparison: MRI lumbar spine 08/12/2018. Lumbar spine x-rays
05/04/2018.

CLINICAL DATA: RIGHT L2-3 microdiskectomy due to a large disc
extrusion and free fragment.

EXAM:
Operative LUMBAR SPINE - 2-3 VIEW

[lateral (1 of 2)]
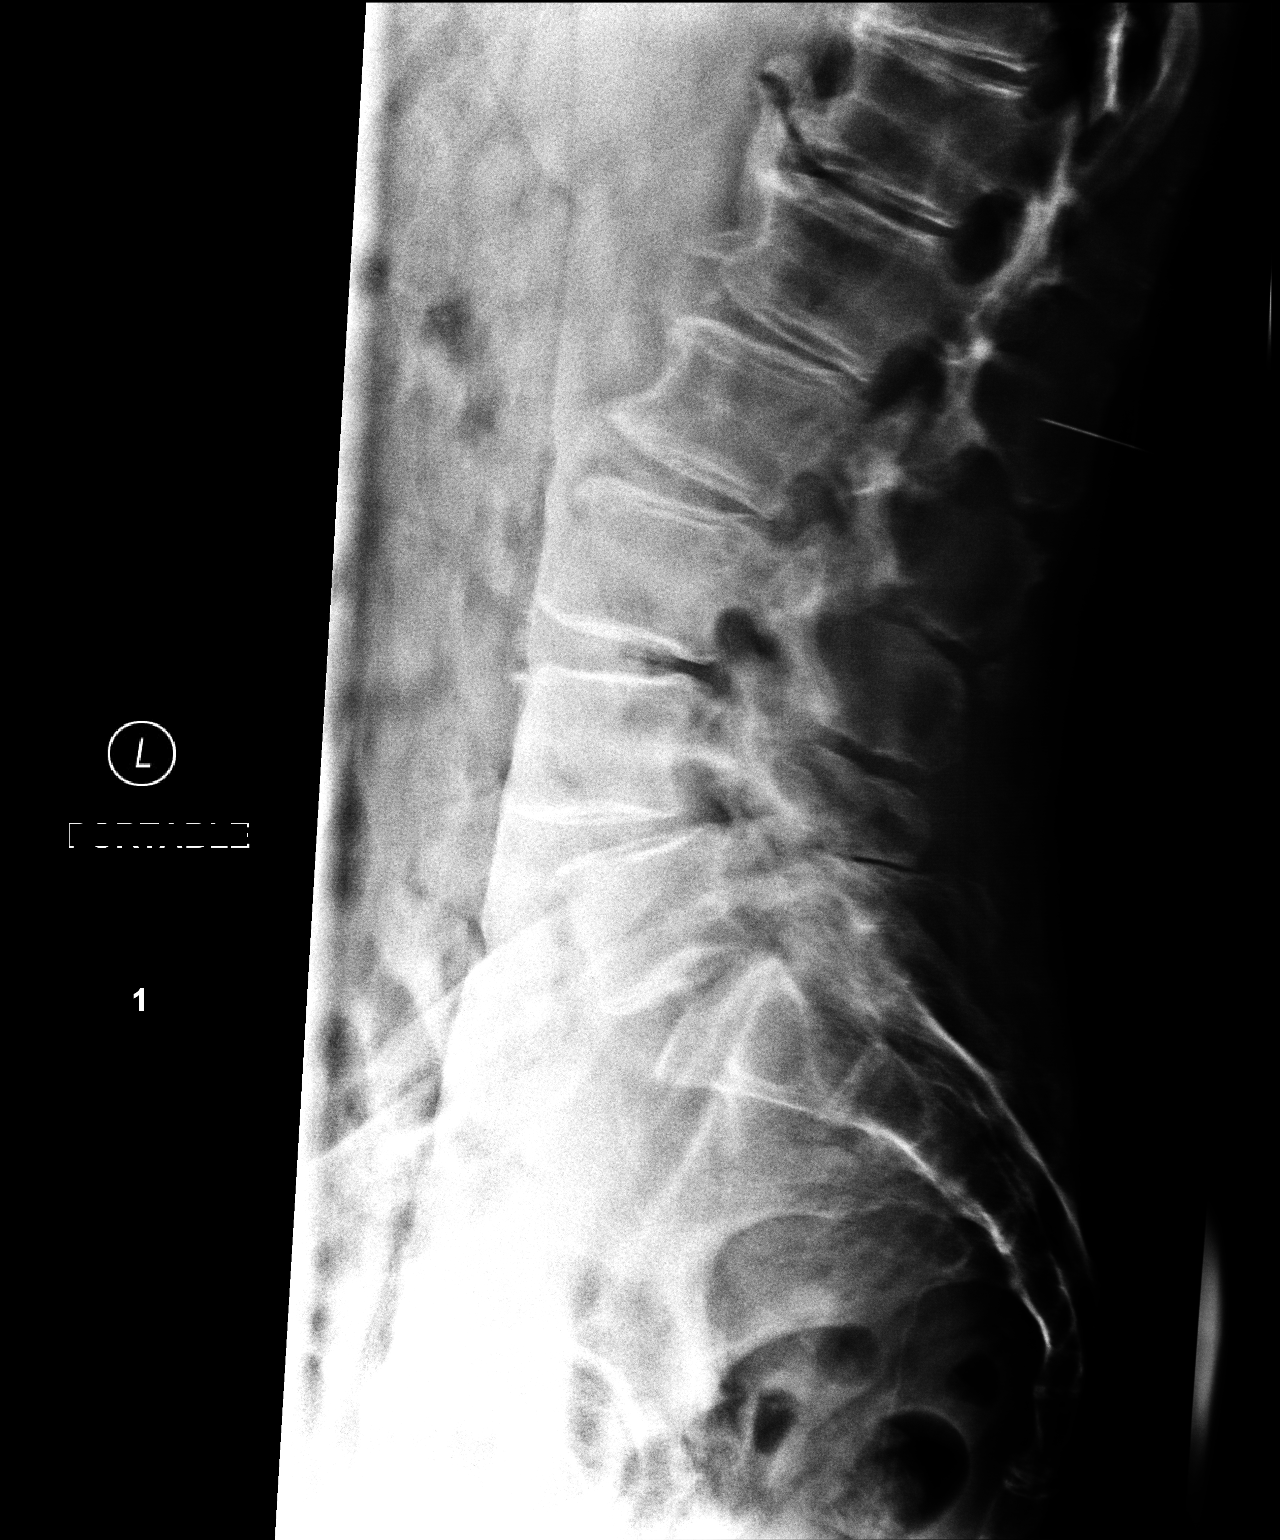

[lateral (2 of 2)]
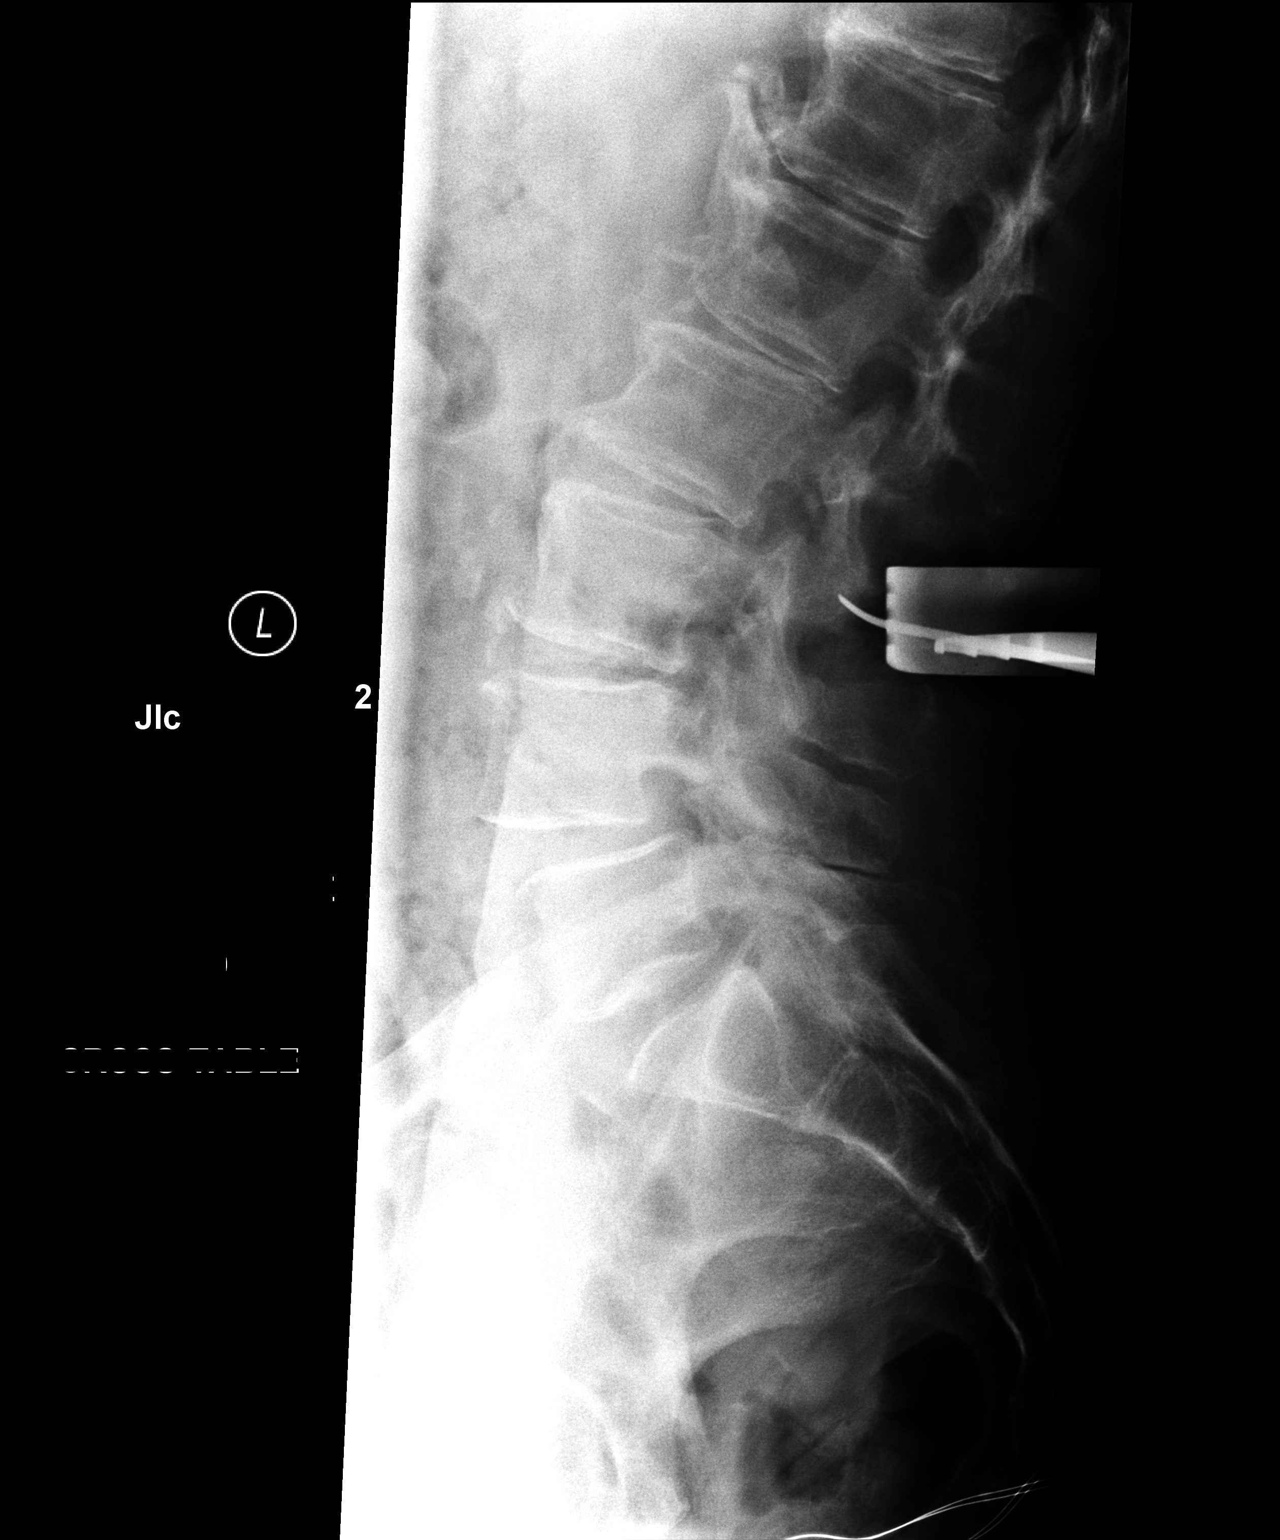

[2 of 2 positions shown; findings below may reference images not displayed]

FINDINGS: The previous examinations demonstrated 5 non-rib-bearing lumbar
vertebrae. These images are submitted for interpretation
postoperatively.

Image # 1 obtained at [DATE] a.m. demonstrates the localizer needle
adjacent to the spinous process of L1, directed toward L1-2.

Image # 2 obtained at [DATE] a.m. demonstrates tissue spreaders with
the localizer device directed toward the L2-3 disc space.

Note is again made of the grade 1 spondylolisthesis of L4 on L5.
IMPRESSION: L2-3 localized intraoperatively.

## 2021-02-12 ENCOUNTER — Other Ambulatory Visit: Payer: Self-pay | Admitting: Family Medicine

## 2021-02-12 DIAGNOSIS — Z1231 Encounter for screening mammogram for malignant neoplasm of breast: Secondary | ICD-10-CM

## 2021-02-13 ENCOUNTER — Ambulatory Visit
Admission: RE | Admit: 2021-02-13 | Discharge: 2021-02-13 | Disposition: A | Discharge status: Home / Self Care | Payer: Medicare PPO | Source: Ambulatory Visit | Attending: Family Medicine | Admitting: Family Medicine

## 2021-02-13 ENCOUNTER — Other Ambulatory Visit: Payer: Self-pay

## 2021-02-13 DIAGNOSIS — Z1231 Encounter for screening mammogram for malignant neoplasm of breast: Secondary | ICD-10-CM

## 2022-11-20 ENCOUNTER — Other Ambulatory Visit: Payer: Self-pay | Admitting: Family Medicine

## 2022-11-20 DIAGNOSIS — Z1231 Encounter for screening mammogram for malignant neoplasm of breast: Secondary | ICD-10-CM

## 2022-11-27 ENCOUNTER — Other Ambulatory Visit: Payer: Self-pay | Admitting: Family Medicine

## 2022-11-27 DIAGNOSIS — Z1231 Encounter for screening mammogram for malignant neoplasm of breast: Secondary | ICD-10-CM

## 2022-11-27 DIAGNOSIS — E2839 Other primary ovarian failure: Secondary | ICD-10-CM

## 2022-12-09 ENCOUNTER — Ambulatory Visit
Admission: RE | Admit: 2022-12-09 | Discharge: 2022-12-09 | Disposition: A | Discharge status: Home / Self Care | Payer: Medicare PPO | Source: Ambulatory Visit | Attending: Family Medicine | Admitting: Family Medicine

## 2022-12-09 DIAGNOSIS — Z1231 Encounter for screening mammogram for malignant neoplasm of breast: Secondary | ICD-10-CM

## 2023-02-11 ENCOUNTER — Other Ambulatory Visit: Payer: Self-pay | Admitting: Family Medicine

## 2023-02-11 DIAGNOSIS — E2839 Other primary ovarian failure: Secondary | ICD-10-CM

## 2023-03-04 NOTE — Therapy (Signed)
OUTPATIENT PHYSICAL THERAPY THORACOLUMBAR EVALUATION   Patient Name: Shannon Garner MRN: [id number] DOB:August 11, 1950, 72 y.o., female Today's Date: 03/09/2023  END OF SESSION:  PT End of Session - 03/09/23 1345     Visit Number 1    Number of Visits 12    Date for PT Re-Evaluation 06/01/23    Authorization Type Francine Graven - auth requested    PT Start Time 1345    PT Stop Time 1425    PT Time Calculation (min) 40 min    Activity Tolerance Patient tolerated treatment well    Behavior During Therapy WFL for tasks assessed/performed             Past Medical History:  Diagnosis Date   Acquired cyst of kidney    Cysts on outside & inside of kidneys   Allergy    Anxiety    Arthritis    Asthma    Bronchitis, not specified as acute or chronic    Cancer ([facility])    skin cancer   Cellulitis and abscess of buttock    Cervicalgia    Depression    Diabetes mellitus    Hyperlipidemia    Hypertension    Insomnia    Leg weakness, bilateral    Lumbago    Memory difficulty    Migraine without aura, without mention of intractable migraine without mention of status migrainosus    Osteoarthrosis, unspecified whether generalized or localized, unspecified site    Pain in joint, lower leg    Pain in joint, shoulder region    Proteinuria    Past Surgical History:  Procedure Laterality Date   ABDOMINAL HYSTERECTOMY     Bone Spurs Removed Left    Left Heel   CARPAL TUNNEL RELEASE     rt/lt   CATARACT EXTRACTION     CERVICAL FUSION  2009   COLONOSCOPY     Corrective rectal surgery     CYSTECTOMY     On vertebrae. Rods put in as well   EYE SURGERY     lt cataract   FOOT SURGERY     HERNIA REPAIR  12/02/2011   hypercholesterol     insomnia     KIDNEY SURGERY     cysts on right kidney   LUMBAR LAMINECTOMY/DECOMPRESSION MICRODISCECTOMY Right 10/05/2018   Procedure: Microdiscectomy - right - L2-L3;  Surgeon: Julio Sicks, MD;  Location: [facility];  Service: Neurosurgery;  Laterality:  Right;  Microdiscectomy - right - L2-L3   lumbar radiculopathy     SPINE SURGERY  2011   cysts on spine   TOTAL KNEE ARTHROPLASTY     Right knee   TUBAL LIGATION     UMBILICAL HERNIA REPAIR  12/02/2011   Procedure: HERNIA REPAIR UMBILICAL ADULT;  Surgeon: Wilmon Arms. Corliss Skains, MD;  Location: [facility];  Service: General;  Laterality: N/A;  Umbilical hernia repair with mesh   Patient Active Problem List   Diagnosis Date Noted   Lumbar radiculopathy 10/05/2018   Umbilical hernia 11/12/2011   Abdominal pain, lower 10/28/2011    PCP: Richmond Campbell, PA  REFERRING PROVIDER: Julio Sicks, MD  REFERRING DIAG: M43.10 (ICD-10-CM) - Spondylolisthesis, site unspecified  Rationale for Evaluation and Treatment: Rehabilitation  THERAPY DIAG:  Lumbar radiculopathy  Muscle weakness (generalized)  ONSET DATE: progressively worsened  over the last couple years   SUBJECTIVE:  SUBJECTIVE STATEMENT: States her pain has been really bad. States that she went to PT before and that didn't help. States that her left groin causes soreness and sets off the pain. States she has pain and difficulties with yard work. States she can stand and move for about 10-15 on a bad day and then she has to sit. But if she sits too long she has pain. Reports pain is in the left groin too. Reports memory problems at baseline.  States she uses a cane for her balance and back pain. Reports it is difficult to get off the floor if she falls but she hasn't fallen in a while.  PERTINENT HISTORY:  DB, stage 3 kidney disease, depression, recent memory changes,  L2-3 10/05/18 right microdisectomy, R TKA,   PAIN:  Are you having pain? Yes: NPRS scale: 3/10 Pain location: left low back hip Pain description: dull/achy/sharp, tender  in the groin Aggravating factors: prolonged positioning, moving around,  Relieving factors: laying down   PRECAUTIONS: None  RED FLAGS: None   WEIGHT BEARING RESTRICTIONS: No  FALLS:  Has patient fallen in last 6 months? No (was falling prior to the surgery)  LIVING ENVIRONMENT: Lives with: lives alone Lives in: House/apartment Stairs: Yes: External: 3 steps; none Has following equipment at home: Single point cane, Shower bench, and Grab bars  OCCUPATION: retired   PLOF: Independent with basic ADLs  PATIENT GOALS: increase mobility and reduce pain , work on floor transfer strength  OBJECTIVE:   DIAGNOSTIC FINDINGS:  None on file   PATIENT SURVEYS:  FOTO 40%  SCREENING FOR RED FLAGS: Bowel or bladder incontinence: No Spinal tumors: No Cauda equina syndrome: No Compression fracture: No Abdominal aneurysm: No  COGNITION: Overall cognitive status: Within functional limits for tasks assessed     SENSATION: Not tested    POSTURE: rounded shoulders, forward head, posterior pelvic tilt, and flexed trunk   PALPATION: Increased resting tone in glutes at rest - clenches glutes at rest- no tenderness to palpation noted  LUMBAR ROM:   AROM eval  Flexion 50% limited  Extension 90% limited  Right lateral flexion 75% limited*  Left lateral flexion 75% limited  Right rotation   Left rotation    (Blank rows = not tested) *pain in low back    LE Measurements Lower Extremity Right EVAL Left EVAL   A/PROM MMT A/PROM MMT  Hip Flexion 100 3+ 95* 3*/**  Hip Extension      Hip Abduction      Hip Adduction      Hip Internal rotation 10  0   Hip External rotation 45  45   Knee Flexion  4  3+**  Knee Extension  4  3+**  Ankle Dorsiflexion  4  4  Ankle Plantarflexion      Ankle Inversion      Ankle Eversion       (Blank rows = not tested) * pain ** uses groin muscle   LUMBAR SPECIAL TESTS:  Straight leg raise test: Negative and Slump test:  Negative  FUNCTIONAL TESTS:  Uses B UE to transition from sit to stand, holds breath with bed mobilities  GAIT: Distance walked: 25 ft in clinic Assistive device utilized: Single point cane Level of assistance: Modified independence Comments: wide BOS hips ER - limited trunk and hip ROM   TODAY'S TREATMENT:  DATE:   03/09/2023  Therapeutic Exercise:  Aerobic: Supine: groin butterfly stretch x10 5" holds, piriformis stretch - too difficult on left Prone:  Seated: glute squeeze - focus on relaxing part of exercise x10 5" holds, hip add iso x10 5" holds   Standing: Neuromuscular Re-education: Manual Therapy: Therapeutic Activity: Self Care: Trigger Point Dry Needling:  Modalities:    PATIENT EDUCATION:  Education details: on current presentation, on HEP, on clinical outcomes score and POC Person educated: Patient Education method: Programmer, multimedia, Demonstration, and Handouts Education comprehension: verbalized understanding   HOME EXERCISE PROGRAM: [id number]  ASSESSMENT:  CLINICAL IMPRESSION: Patient presents to physical therapy with complaints of chronic low back pain with history of lumbar surgery in 2020.  Patient presents with significant limitations in hip range of motion's and compensatory strategies using groin muscles for ambulation and lower extremity movements.  Educated patient current presentation as well as plan moving forward.  Patient also has deficits with transitional movements and has expressed concerns about safely getting on and off the floor due to strength and low back pain.  Will incorporate balance and lower extremity strengthening to help improve with these functional tasks at home and in the community.  Patient would greatly benefit from skilled PT to reduce pain and improve transitional movements.  OBJECTIVE IMPAIRMENTS: decreased  activity tolerance, decreased knowledge of use of DME, decreased mobility, difficulty walking, decreased ROM, decreased strength, improper body mechanics, postural dysfunction, and pain.   ACTIVITY LIMITATIONS: lifting, sitting, standing, squatting, stairs, transfers, bed mobility, and locomotion level  PARTICIPATION LIMITATIONS: cleaning, community activity, and yard work  PERSONAL FACTORS: Age, Fitness, and 1-2 comorbidities: lumbar surgery, dementia   are also affecting patient's functional outcome.   REHAB POTENTIAL: Good  CLINICAL DECISION MAKING: Stable/uncomplicated  EVALUATION COMPLEXITY: Low   GOALS: Goals reviewed with patient? yes  SHORT TERM GOALS: Target date: 04/20/2023  Patient will be independent in self management strategies to improve quality of life and functional outcomes. Baseline: New Program Goal status: INITIAL  2.  Patient will report at least 25% improvement in overall symptoms and/or function to demonstrate improved functional mobility Baseline: 0% better Goal status: INITIAL  3.  Patient will demonstrate at least 10 degrees of hip IR to improve hip mobility. Baseline: see above Goal status: INITIAL  4.  Patient will demonstrate painfree lumbar ROM in all directions. Baseline: painful Goal status: INITIAL    LONG TERM GOALS: Target date: 06/01/2023  Patient will report at least 50% improvement in overall symptoms and/or function to demonstrate improved functional mobility Baseline: 0% better Goal status: INITIAL  2.  Patient will improve score on FOTO outcomes measure to projected score to demonstrate overall improved function and QOL Baseline: see above Goal status: INITIAL  3.  Patient will be able to transition from sit to stand without UE to demonstrate improved LE strength.  Baseline: unable - uses hands Goal status: INITIAL    PLAN:  PT FREQUENCY: 1-2x/week for a total of 12 visits   PT DURATION: 12 weeks  PLANNED  INTERVENTIONS: Therapeutic exercises, Therapeutic activity, Neuromuscular re-education, Balance training, Gait training, Patient/Family education, Self Care, Joint mobilization, Joint manipulation, Stair training, Vestibular training, Canalith repositioning, Orthotic/Fit training, Prosthetic training, DME instructions, Aquatic Therapy, Dry Needling, Electrical stimulation, Spinal manipulation, Spinal mobilization, Cryotherapy, Moist heat, Taping, Traction, Ultrasound, Ionotophoresis 4mg /ml Dexamethasone, Manual therapy, and Re-evaluation.   PLAN FOR NEXT SESSION: test - educate on walking program, hip and lumbar ROM , isometrics, table strengthening, breath work, work on  floor transfer strength  3:28 PM, 03/09/23 Tereasa Coop, DPT Physical Therapy with [facility]

## 2023-03-09 ENCOUNTER — Other Ambulatory Visit: Payer: Self-pay

## 2023-03-09 ENCOUNTER — Encounter: Payer: Self-pay | Admitting: Physical Therapy

## 2023-03-09 ENCOUNTER — Ambulatory Visit: Payer: Medicare PPO | Admitting: Physical Therapy

## 2023-03-09 DIAGNOSIS — M6281 Muscle weakness (generalized): Secondary | ICD-10-CM | POA: Diagnosis not present

## 2023-03-09 DIAGNOSIS — M5416 Radiculopathy, lumbar region: Secondary | ICD-10-CM | POA: Diagnosis not present

## 2023-03-11 ENCOUNTER — Encounter: Payer: Medicare PPO | Admitting: Physical Therapy

## 2023-03-16 ENCOUNTER — Encounter: Payer: Self-pay | Admitting: Physical Therapy

## 2023-03-16 ENCOUNTER — Ambulatory Visit: Payer: Medicare PPO | Admitting: Physical Therapy

## 2023-03-16 DIAGNOSIS — M6281 Muscle weakness (generalized): Secondary | ICD-10-CM

## 2023-03-16 DIAGNOSIS — M5416 Radiculopathy, lumbar region: Secondary | ICD-10-CM | POA: Diagnosis not present

## 2023-03-16 NOTE — Therapy (Signed)
OUTPATIENT PHYSICAL THERAPY THORACOLUMBAR TREATMENT   Patient Name: Shannon Garner MRN: [id number] DOB:03/18/1951, 72 y.o., female Today's Date: 03/16/2023  END OF SESSION:  PT End of Session - 03/16/23 1349     Visit Number 2    Number of Visits 12    Date for PT Re-Evaluation 06/01/23    Authorization Type Francine Graven - auth for 12 visits from 9/23 to 12/16    Authorization - Visit Number 2    Authorization - Number of Visits 12    Progress Note Due on Visit 10    PT Start Time 1350    PT Stop Time 1428    PT Time Calculation (min) 38 min    Activity Tolerance Patient tolerated treatment well    Behavior During Therapy WFL for tasks assessed/performed             Past Medical History:  Diagnosis Date   Acquired cyst of kidney    Cysts on outside & inside of kidneys   Allergy    Anxiety    Arthritis    Asthma    Bronchitis, not specified as acute or chronic    Cancer (HCC)    skin cancer   Cellulitis and abscess of buttock    Cervicalgia    Depression    Diabetes mellitus    Hyperlipidemia    Hypertension    Insomnia    Leg weakness, bilateral    Lumbago    Memory difficulty    Migraine without aura, without mention of intractable migraine without mention of status migrainosus    Osteoarthrosis, unspecified whether generalized or localized, unspecified site    Pain in joint, lower leg    Pain in joint, shoulder region    Proteinuria    Past Surgical History:  Procedure Laterality Date   ABDOMINAL HYSTERECTOMY     Bone Spurs Removed Left    Left Heel   CARPAL TUNNEL RELEASE     rt/lt   CATARACT EXTRACTION     CERVICAL FUSION  2009   COLONOSCOPY     Corrective rectal surgery     CYSTECTOMY     On vertebrae. Rods put in as well   EYE SURGERY     lt cataract   FOOT SURGERY     HERNIA REPAIR  12/02/2011   hypercholesterol     insomnia     KIDNEY SURGERY     cysts on right kidney   LUMBAR LAMINECTOMY/DECOMPRESSION MICRODISCECTOMY Right 10/05/2018    Procedure: Microdiscectomy - right - L2-L3;  Surgeon: Julio Sicks, MD;  Location: [facility] OR;  Service: Neurosurgery;  Laterality: Right;  Microdiscectomy - right - L2-L3   lumbar radiculopathy     SPINE SURGERY  2011   cysts on spine   TOTAL KNEE ARTHROPLASTY     Right knee   TUBAL LIGATION     UMBILICAL HERNIA REPAIR  12/02/2011   Procedure: HERNIA REPAIR UMBILICAL ADULT;  Surgeon: Wilmon Arms. Corliss Skains, MD;  Location: [facility];  Service: General;  Laterality: N/A;  Umbilical hernia repair with mesh   Patient Active Problem List   Diagnosis Date Noted   Lumbar radiculopathy 10/05/2018   Umbilical hernia 11/12/2011   Abdominal pain, lower 10/28/2011    PCP: Richmond Campbell, PA  REFERRING PROVIDER: Julio Sicks, MD  REFERRING DIAG: M43.10 (ICD-10-CM) - Spondylolisthesis, site unspecified  Rationale for Evaluation and Treatment: Rehabilitation  THERAPY DIAG:  Lumbar radiculopathy  Muscle weakness (generalized)  ONSET DATE: progressively  worsened  over the last couple years   SUBJECTIVE:                                                                                                                                                                                           SUBJECTIVE STATEMENT: 03/16/2023 States she feels better and it feels like the exercises are helping. Reports groin symptoms are better and she is not as tense.  Eval: States her pain has been really bad. States that she went to PT before and that didn't help. States that her left groin causes soreness and sets off the pain. States she has pain and difficulties with yard work. States she can stand and move for about 10-15 on a bad day and then she has to sit. But if she sits too long she has pain. Reports pain is in the left groin too. Reports memory problems at baseline.  States she uses a cane for her balance and back pain. Reports it is difficult to get off the floor if she falls but she hasn't fallen in  a while.  PERTINENT HISTORY:  DB, stage 3 kidney disease, depression, recent memory changes,  L2-3 10/05/18 right microdisectomy, R TKA,   PAIN:  Are you having pain? Yes: NPRS scale: 1/10 Pain location: left low back hip Pain description: dull/achy/sharp, tender in the groin Aggravating factors: prolonged positioning, moving around,  Relieving factors: laying down   PRECAUTIONS: None  RED FLAGS: None   WEIGHT BEARING RESTRICTIONS: No  FALLS:  Has patient fallen in last 6 months? No (was falling prior to the surgery)  LIVING ENVIRONMENT: Lives with: lives alone Lives in: House/apartment Stairs: Yes: External: 3 steps; none Has following equipment at home: Single point cane, Shower bench, and Grab bars  OCCUPATION: retired   PLOF: Independent with basic ADLs  PATIENT GOALS: increase mobility and reduce pain , work on floor transfer strength  OBJECTIVE:   DIAGNOSTIC FINDINGS:  None on file   PATIENT SURVEYS:  FOTO 40%  SCREENING FOR RED FLAGS: Bowel or bladder incontinence: No Spinal tumors: No Cauda equina syndrome: No Compression fracture: No Abdominal aneurysm: No  COGNITION: Overall cognitive status: Within functional limits for tasks assessed     SENSATION: Not tested    POSTURE: rounded shoulders, forward head, posterior pelvic tilt, and flexed trunk   PALPATION: Increased resting tone in glutes at rest - clenches glutes at rest- no tenderness to palpation noted  LUMBAR ROM:   AROM eval  Flexion 50% limited  Extension 90% limited  Right lateral flexion 75% limited*  Left lateral flexion 75% limited  Right rotation  Left rotation    (Blank rows = not tested) *pain in low back    LE Measurements Lower Extremity Right EVAL Left EVAL   A/PROM MMT A/PROM MMT  Hip Flexion 100 3+ 95* 3*/**  Hip Extension      Hip Abduction      Hip Adduction      Hip Internal rotation 10  0   Hip External rotation 45  45   Knee Flexion  4  3+**   Knee Extension  4  3+**  Ankle Dorsiflexion  4  4  Ankle Plantarflexion      Ankle Inversion      Ankle Eversion       (Blank rows = not tested) * pain ** uses groin muscle   LUMBAR SPECIAL TESTS:  Straight leg raise test: Negative and Slump test: Negative  FUNCTIONAL TESTS:  03/16/23 350 feet - around 300 feet you had increase in pain and started use cane at this point.  Uses B UE to transition from sit to stand, holds breath with bed mobilities  GAIT: Distance walked: 25 ft in clinic Assistive device utilized: Single point cane Level of assistance: Modified independence Comments: wide BOS hips ER - limited trunk and hip ROM   TODAY'S TREATMENT:                                                                                                                              DATE:   03/16/2023  Therapeutic Exercise:  Aerobic: Supine: groin butterfly stretch  5" holds 2 minute bouts , hip add iso 5" hlds 2 minutes, hip abd 2 minutes green band, LTR 2 minutes, bridges 3x10 5" holds Prone:  Seated: glute squeeze - focus on relaxing part of exercise x10 5" holds, hip add iso x10 5" holds   Standing: Neuromuscular Re-education: Manual Therapy: IASTM with percussion gun to left low back - tolerated well. Therapeutic Activity: Self Care: Trigger Point Dry Needling:  Modalities:    PATIENT EDUCATION:  Education details: on walking program on use of cane for pain managements and test results  Person educated: Patient Education method: Explanation, Demonstration, and Handouts Education comprehension: verbalized understanding   HOME EXERCISE PROGRAM: [id number]  ASSESSMENT:  CLINICAL IMPRESSION: 03/16/2023 Focused on review of HEP and progression of exercises. No pain noted during session. Added all new exercises to HEP. Added STM and walking program to plan. Reduced pain and tension noted end of session. Will continue with current POC as tolerated.    Eval: Patient  presents to physical therapy with complaints of chronic low back pain with history of lumbar surgery in 2020.  Patient presents with significant limitations in hip range of motion's and compensatory strategies using groin muscles for ambulation and lower extremity movements.  Educated patient current presentation as well as plan moving forward.  Patient also has deficits with transitional movements and has expressed concerns about safely getting on and off the  floor due to strength and low back pain.  Will incorporate balance and lower extremity strengthening to help improve with these functional tasks at home and in the community.  Patient would greatly benefit from skilled PT to reduce pain and improve transitional movements.  OBJECTIVE IMPAIRMENTS: decreased activity tolerance, decreased knowledge of use of DME, decreased mobility, difficulty walking, decreased ROM, decreased strength, improper body mechanics, postural dysfunction, and pain.   ACTIVITY LIMITATIONS: lifting, sitting, standing, squatting, stairs, transfers, bed mobility, and locomotion level  PARTICIPATION LIMITATIONS: cleaning, community activity, and yard work  PERSONAL FACTORS: Age, Fitness, and 1-2 comorbidities: lumbar surgery, dementia   are also affecting patient's functional outcome.   REHAB POTENTIAL: Good  CLINICAL DECISION MAKING: Stable/uncomplicated  EVALUATION COMPLEXITY: Low   GOALS: Goals reviewed with patient? yes  SHORT TERM GOALS: Target date: 04/20/2023  Patient will be independent in self management strategies to improve quality of life and functional outcomes. Baseline: New Program Goal status: INITIAL  2.  Patient will report at least 25% improvement in overall symptoms and/or function to demonstrate improved functional mobility Baseline: 0% better Goal status: INITIAL  3.  Patient will demonstrate at least 10 degrees of hip IR to improve hip mobility. Baseline: see above Goal status:  INITIAL  4.  Patient will demonstrate painfree lumbar ROM in all directions. Baseline: painful Goal status: INITIAL    LONG TERM GOALS: Target date: 06/01/2023  Patient will report at least 50% improvement in overall symptoms and/or function to demonstrate improved functional mobility Baseline: 0% better Goal status: INITIAL  2.  Patient will improve score on FOTO outcomes measure to projected score to demonstrate overall improved function and QOL Baseline: see above Goal status: INITIAL  3.  Patient will be able to transition from sit to stand without UE to demonstrate improved LE strength.  Baseline: unable - uses hands Goal status: INITIAL    PLAN:  PT FREQUENCY: 1-2x/week for a total of 12 visits   PT DURATION: 12 weeks  PLANNED INTERVENTIONS: Therapeutic exercises, Therapeutic activity, Neuromuscular re-education, Balance training, Gait training, Patient/Family education, Self Care, Joint mobilization, Joint manipulation, Stair training, Vestibular training, Canalith repositioning, Orthotic/Fit training, Prosthetic training, DME instructions, Aquatic Therapy, Dry Needling, Electrical stimulation, Spinal manipulation, Spinal mobilization, Cryotherapy, Moist heat, Taping, Traction, Ultrasound, Ionotophoresis 4mg /ml Dexamethasone, Manual therapy, and Re-evaluation.   PLAN FOR NEXT SESSION:  percussion gun, f/u with walking program. hip and lumbar ROM , isometrics, table strengthening, breath work, work on floor transfer strength  2:33 PM, 03/16/23 Tereasa Coop, DPT Physical Therapy with [facility] L[facility]

## 2023-03-19 ENCOUNTER — Ambulatory Visit: Payer: Medicare PPO | Admitting: Physical Therapy

## 2023-03-19 ENCOUNTER — Encounter: Payer: Self-pay | Admitting: Physical Therapy

## 2023-03-19 DIAGNOSIS — M6281 Muscle weakness (generalized): Secondary | ICD-10-CM | POA: Diagnosis not present

## 2023-03-19 DIAGNOSIS — M5416 Radiculopathy, lumbar region: Secondary | ICD-10-CM | POA: Diagnosis not present

## 2023-03-19 NOTE — Therapy (Signed)
OUTPATIENT PHYSICAL THERAPY THORACOLUMBAR TREATMENT   Patient Name: Shannon Garner MRN: [id number] DOB:1951/02/18, 72 y.o., female Today's Date: 03/19/2023  END OF SESSION:  PT End of Session - 03/19/23 1257     Visit Number 3    Number of Visits 12    Date for PT Re-Evaluation 06/01/23    Authorization Type Francine Graven - auth for 12 visits from 9/23 to 12/16    Authorization - Visit Number 3    Authorization - Number of Visits 12    Progress Note Due on Visit 10    PT Start Time 1300    PT Stop Time 1340    PT Time Calculation (min) 40 min    Activity Tolerance Patient tolerated treatment well    Behavior During Therapy WFL for tasks assessed/performed             Past Medical History:  Diagnosis Date   Acquired cyst of kidney    Cysts on outside & inside of kidneys   Allergy    Anxiety    Arthritis    Asthma    Bronchitis, not specified as acute or chronic    Cancer (HCC)    skin cancer   Cellulitis and abscess of buttock    Cervicalgia    Depression    Diabetes mellitus    Hyperlipidemia    Hypertension    Insomnia    Leg weakness, bilateral    Lumbago    Memory difficulty    Migraine without aura, without mention of intractable migraine without mention of status migrainosus    Osteoarthrosis, unspecified whether generalized or localized, unspecified site    Pain in joint, lower leg    Pain in joint, shoulder region    Proteinuria    Past Surgical History:  Procedure Laterality Date   ABDOMINAL HYSTERECTOMY     Bone Spurs Removed Left    Left Heel   CARPAL TUNNEL RELEASE     rt/lt   CATARACT EXTRACTION     CERVICAL FUSION  2009   COLONOSCOPY     Corrective rectal surgery     CYSTECTOMY     On vertebrae. Rods put in as well   EYE SURGERY     lt cataract   FOOT SURGERY     HERNIA REPAIR  12/02/2011   hypercholesterol     insomnia     KIDNEY SURGERY     cysts on right kidney   LUMBAR LAMINECTOMY/DECOMPRESSION MICRODISCECTOMY Right 10/05/2018    Procedure: Microdiscectomy - right - L2-L3;  Surgeon: Julio Sicks, MD;  Location: [facility] OR;  Service: Neurosurgery;  Laterality: Right;  Microdiscectomy - right - L2-L3   lumbar radiculopathy     SPINE SURGERY  2011   cysts on spine   TOTAL KNEE ARTHROPLASTY     Right knee   TUBAL LIGATION     UMBILICAL HERNIA REPAIR  12/02/2011   Procedure: HERNIA REPAIR UMBILICAL ADULT;  Surgeon: Wilmon Arms. Corliss Skains, MD;  Location: [facility];  Service: General;  Laterality: N/A;  Umbilical hernia repair with mesh   Patient Active Problem List   Diagnosis Date Noted   Lumbar radiculopathy 10/05/2018   Umbilical hernia 11/12/2011   Abdominal pain, lower 10/28/2011    PCP: Richmond Campbell, PA  REFERRING PROVIDER: Julio Sicks, MD  REFERRING DIAG: M43.10 (ICD-10-CM) - Spondylolisthesis, site unspecified  Rationale for Evaluation and Treatment: Rehabilitation  THERAPY DIAG:  Lumbar radiculopathy  Muscle weakness (generalized)  ONSET DATE: progressively  worsened  over the last couple years   SUBJECTIVE:                                                                                                                                                                                           SUBJECTIVE STATEMENT: 03/19/2023 States she is feeling better in her back. Reports she has been doing her exercises and no pain today.  Eval: States her pain has been really bad. States that she went to PT before and that didn't help. States that her left groin causes soreness and sets off the pain. States she has pain and difficulties with yard work. States she can stand and move for about 10-15 on a bad day and then she has to sit. But if she sits too long she has pain. Reports pain is in the left groin too. Reports memory problems at baseline.  States she uses a cane for her balance and back pain. Reports it is difficult to get off the floor if she falls but she hasn't fallen in a while.  PERTINENT  HISTORY:  DB, stage 3 kidney disease, depression, recent memory changes,  L2-3 10/05/18 right microdisectomy, R TKA,   PAIN:  Are you having pain? Yes: NPRS scale: 0/10 Pain location: left low back hip Pain description: dull/achy/sharp, tender in the groin Aggravating factors: prolonged positioning, moving around,  Relieving factors: laying down   PRECAUTIONS: None  RED FLAGS: None   WEIGHT BEARING RESTRICTIONS: No  FALLS:  Has patient fallen in last 6 months? No (was falling prior to the surgery)  LIVING ENVIRONMENT: Lives with: lives alone Lives in: House/apartment Stairs: Yes: External: 3 steps; none Has following equipment at home: Single point cane, Shower bench, and Grab bars  OCCUPATION: retired   PLOF: Independent with basic ADLs  PATIENT GOALS: increase mobility and reduce pain , work on floor transfer strength  OBJECTIVE:   DIAGNOSTIC FINDINGS:  None on file   PATIENT SURVEYS:  FOTO 40%  SCREENING FOR RED FLAGS: Bowel or bladder incontinence: No Spinal tumors: No Cauda equina syndrome: No Compression fracture: No Abdominal aneurysm: No  COGNITION: Overall cognitive status: Within functional limits for tasks assessed     SENSATION: Not tested    POSTURE: rounded shoulders, forward head, posterior pelvic tilt, and flexed trunk   PALPATION: Increased resting tone in glutes at rest - clenches glutes at rest- no tenderness to palpation noted  LUMBAR ROM:   AROM eval  Flexion 50% limited  Extension 90% limited  Right lateral flexion 75% limited*  Left lateral flexion 75% limited  Right rotation   Left rotation    (  Blank rows = not tested) *pain in low back    LE Measurements Lower Extremity Right EVAL Left EVAL   A/PROM MMT A/PROM MMT  Hip Flexion 100 3+ 95* 3*/**  Hip Extension      Hip Abduction      Hip Adduction      Hip Internal rotation 10  0   Hip External rotation 45  45   Knee Flexion  4  3+**  Knee Extension  4  3+**   Ankle Dorsiflexion  4  4  Ankle Plantarflexion      Ankle Inversion      Ankle Eversion       (Blank rows = not tested) * pain ** uses groin muscle   LUMBAR SPECIAL TESTS:  Straight leg raise test: Negative and Slump test: Negative  FUNCTIONAL TESTS:  03/16/23 350 feet - around 300 feet you had increase in pain and started use cane at this point.  Uses B UE to transition from sit to stand, holds breath with bed mobilities  GAIT: Distance walked: 25 ft in clinic Assistive device utilized: Single point cane Level of assistance: Modified independence Comments: wide BOS hips ER - limited trunk and hip ROM   TODAY'S TREATMENT:                                                                                                                              DATE:   03/19/2023  Therapeutic Exercise:  Aerobic: Supine: groin butterfly stretch  5" holds 2 minute bouts , hip add iso 5" hlds 2 minutes, hip abd 2 minutes green band, LTR 2 minutes, bridges 2x10 5" holds, ASLR 4x5 B S/l: clamshell 2x10 B  Seated:   Standing: Neuromuscular Re-education: Manual Therapy: IASTM with percussion gun to bilateral upper and lower paraspinals low back - tolerated well. Therapeutic Activity: Self Care: Trigger Point Dry Needling:  Modalities:    PATIENT EDUCATION:  Education details: on HEP Person educated: Patient Education method: Explanation, Demonstration, and Handouts Education comprehension: verbalized understanding   HOME EXERCISE PROGRAM: [id number]  ASSESSMENT:  CLINICAL IMPRESSION: 03/19/2023 Tolerated new exercises well. Initially cramping in hamstring in contralateral leg with SLR but this resolved with repetition. Fatigue in legs noted and when this happened patient noted increased groin muscle activation. Cued patient for rest breaks which helped. Added new exercises to HEP. Ended with manual interventions to reduce tension in back and this was tolerated well too.    Eval:  Patient presents to physical therapy with complaints of chronic low back pain with history of lumbar surgery in 2020.  Patient presents with significant limitations in hip range of motion's and compensatory strategies using groin muscles for ambulation and lower extremity movements.  Educated patient current presentation as well as plan moving forward.  Patient also has deficits with transitional movements and has expressed concerns about safely getting on and off the floor due to strength and low back pain.  Will incorporate balance and lower extremity strengthening to help improve with these functional tasks at home and in the community.  Patient would greatly benefit from skilled PT to reduce pain and improve transitional movements.  OBJECTIVE IMPAIRMENTS: decreased activity tolerance, decreased knowledge of use of DME, decreased mobility, difficulty walking, decreased ROM, decreased strength, improper body mechanics, postural dysfunction, and pain.   ACTIVITY LIMITATIONS: lifting, sitting, standing, squatting, stairs, transfers, bed mobility, and locomotion level  PARTICIPATION LIMITATIONS: cleaning, community activity, and yard work  PERSONAL FACTORS: Age, Fitness, and 1-2 comorbidities: lumbar surgery, dementia   are also affecting patient's functional outcome.   REHAB POTENTIAL: Good  CLINICAL DECISION MAKING: Stable/uncomplicated  EVALUATION COMPLEXITY: Low   GOALS: Goals reviewed with patient? yes  SHORT TERM GOALS: Target date: 04/20/2023  Patient will be independent in self management strategies to improve quality of life and functional outcomes. Baseline: New Program Goal status: INITIAL  2.  Patient will report at least 25% improvement in overall symptoms and/or function to demonstrate improved functional mobility Baseline: 0% better Goal status: INITIAL  3.  Patient will demonstrate at least 10 degrees of hip IR to improve hip mobility. Baseline: see above Goal status:  INITIAL  4.  Patient will demonstrate painfree lumbar ROM in all directions. Baseline: painful Goal status: INITIAL    LONG TERM GOALS: Target date: 06/01/2023  Patient will report at least 50% improvement in overall symptoms and/or function to demonstrate improved functional mobility Baseline: 0% better Goal status: INITIAL  2.  Patient will improve score on FOTO outcomes measure to projected score to demonstrate overall improved function and QOL Baseline: see above Goal status: INITIAL  3.  Patient will be able to transition from sit to stand without UE to demonstrate improved LE strength.  Baseline: unable - uses hands Goal status: INITIAL    PLAN:  PT FREQUENCY: 1-2x/week for a total of 12 visits   PT DURATION: 12 weeks  PLANNED INTERVENTIONS: Therapeutic exercises, Therapeutic activity, Neuromuscular re-education, Balance training, Gait training, Patient/Family education, Self Care, Joint mobilization, Joint manipulation, Stair training, Vestibular training, Canalith repositioning, Orthotic/Fit training, Prosthetic training, DME instructions, Aquatic Therapy, Dry Needling, Electrical stimulation, Spinal manipulation, Spinal mobilization, Cryotherapy, Moist heat, Taping, Traction, Ultrasound, Ionotophoresis 4mg /ml Dexamethasone, Manual therapy, and Re-evaluation.   PLAN FOR NEXT SESSION:  percussion gun, f/u with walking program. hip and lumbar ROM , isometrics, table strengthening, breath work, work on floor transfer strength  1:43 PM, 03/19/23 Tereasa Coop, DPT Physical Therapy with Dolores Lory

## 2023-03-23 ENCOUNTER — Encounter: Payer: Self-pay | Admitting: Physical Therapy

## 2023-03-23 ENCOUNTER — Ambulatory Visit: Payer: Medicare PPO | Admitting: Physical Therapy

## 2023-03-23 DIAGNOSIS — M5416 Radiculopathy, lumbar region: Secondary | ICD-10-CM | POA: Diagnosis not present

## 2023-03-23 DIAGNOSIS — M6281 Muscle weakness (generalized): Secondary | ICD-10-CM

## 2023-03-23 NOTE — Therapy (Signed)
OUTPATIENT PHYSICAL THERAPY THORACOLUMBAR TREATMENT   Patient Name: Shannon Garner MRN: [id number] DOB:1950/09/27, 72 y.o., female Today's Date: 03/23/2023  END OF SESSION:  PT End of Session - 03/23/23 1346     Visit Number 4    Number of Visits 12    Date for PT Re-Evaluation 06/01/23    Authorization Type Francine Graven - auth for 12 visits from 9/23 to 12/16    Authorization - Visit Number 4    Authorization - Number of Visits 12    Progress Note Due on Visit 10    PT Start Time 1347    PT Stop Time 1426    PT Time Calculation (min) 39 min    Activity Tolerance Patient tolerated treatment well    Behavior During Therapy WFL for tasks assessed/performed             Past Medical History:  Diagnosis Date   Acquired cyst of kidney    Cysts on outside & inside of kidneys   Allergy    Anxiety    Arthritis    Asthma    Bronchitis, not specified as acute or chronic    Cancer (HCC)    skin cancer   Cellulitis and abscess of buttock    Cervicalgia    Depression    Diabetes mellitus    Hyperlipidemia    Hypertension    Insomnia    Leg weakness, bilateral    Lumbago    Memory difficulty    Migraine without aura, without mention of intractable migraine without mention of status migrainosus    Osteoarthrosis, unspecified whether generalized or localized, unspecified site    Pain in joint, lower leg    Pain in joint, shoulder region    Proteinuria    Past Surgical History:  Procedure Laterality Date   ABDOMINAL HYSTERECTOMY     Bone Spurs Removed Left    Left Heel   CARPAL TUNNEL RELEASE     rt/lt   CATARACT EXTRACTION     CERVICAL FUSION  2009   COLONOSCOPY     Corrective rectal surgery     CYSTECTOMY     On vertebrae. Rods put in as well   EYE SURGERY     lt cataract   FOOT SURGERY     HERNIA REPAIR  12/02/2011   hypercholesterol     insomnia     KIDNEY SURGERY     cysts on right kidney   LUMBAR LAMINECTOMY/DECOMPRESSION MICRODISCECTOMY Right 10/05/2018    Procedure: Microdiscectomy - right - L2-L3;  Surgeon: Julio Sicks, MD;  Location: [facility] OR;  Service: Neurosurgery;  Laterality: Right;  Microdiscectomy - right - L2-L3   lumbar radiculopathy     SPINE SURGERY  2011   cysts on spine   TOTAL KNEE ARTHROPLASTY     Right knee   TUBAL LIGATION     UMBILICAL HERNIA REPAIR  12/02/2011   Procedure: HERNIA REPAIR UMBILICAL ADULT;  Surgeon: Wilmon Arms. Corliss Skains, MD;  Location: [facility];  Service: General;  Laterality: N/A;  Umbilical hernia repair with mesh   Patient Active Problem List   Diagnosis Date Noted   Lumbar radiculopathy 10/05/2018   Umbilical hernia 11/12/2011   Abdominal pain, lower 10/28/2011    PCP: Richmond Campbell, PA  REFERRING PROVIDER: Julio Sicks, MD  REFERRING DIAG: M43.10 (ICD-10-CM) - Spondylolisthesis, site unspecified  Rationale for Evaluation and Treatment: Rehabilitation  THERAPY DIAG:  Lumbar radiculopathy  Muscle weakness (generalized)  ONSET DATE: progressively  worsened  over the last couple years   SUBJECTIVE:                                                                                                                                                                                           SUBJECTIVE STATEMENT: 03/23/2023 States her neck has bothering her but she is feeling better in her back and hip.  Eval: States her pain has been really bad. States that she went to PT before and that didn't help. States that her left groin causes soreness and sets off the pain. States she has pain and difficulties with yard work. States she can stand and move for about 10-15 on a bad day and then she has to sit. But if she sits too long she has pain. Reports pain is in the left groin too. Reports memory problems at baseline.  States she uses a cane for her balance and back pain. Reports it is difficult to get off the floor if she falls but she hasn't fallen in a while.  PERTINENT HISTORY:  DB, stage 3  kidney disease, depression, recent memory changes,  L2-3 10/05/18 right microdisectomy, R TKA,   PAIN:  Are you having pain? Yes: NPRS scale: 1-2/10 Pain location: neck Pain description: dull/achy/tight Aggravating factors: prolonged positioning, moving around,  Relieving factors: laying down   PRECAUTIONS: None  RED FLAGS: None   WEIGHT BEARING RESTRICTIONS: No  FALLS:  Has patient fallen in last 6 months? No (was falling prior to the surgery)  LIVING ENVIRONMENT: Lives with: lives alone Lives in: House/apartment Stairs: Yes: External: 3 steps; none Has following equipment at home: Single point cane, Shower bench, and Grab bars  OCCUPATION: retired   PLOF: Independent with basic ADLs  PATIENT GOALS: increase mobility and reduce pain , work on floor transfer strength  OBJECTIVE:   DIAGNOSTIC FINDINGS:  None on file   PATIENT SURVEYS:  FOTO 40%  SCREENING FOR RED FLAGS: Bowel or bladder incontinence: No Spinal tumors: No Cauda equina syndrome: No Compression fracture: No Abdominal aneurysm: No  COGNITION: Overall cognitive status: Within functional limits for tasks assessed     SENSATION: Not tested    POSTURE: rounded shoulders, forward head, posterior pelvic tilt, and flexed trunk   PALPATION: Increased resting tone in glutes at rest - clenches glutes at rest- no tenderness to palpation noted  LUMBAR ROM:   AROM eval  Flexion 50% limited  Extension 90% limited  Right lateral flexion 75% limited*  Left lateral flexion 75% limited  Right rotation   Left rotation    (Blank rows = not tested) *pain in low back  LE Measurements Lower Extremity Right EVAL Left EVAL   A/PROM MMT A/PROM MMT  Hip Flexion 100 3+ 95* 3*/**  Hip Extension      Hip Abduction      Hip Adduction      Hip Internal rotation 10  0   Hip External rotation 45  45   Knee Flexion  4  3+**  Knee Extension  4  3+**  Ankle Dorsiflexion  4  4  Ankle Plantarflexion       Ankle Inversion      Ankle Eversion       (Blank rows = not tested) * pain ** uses groin muscle   LUMBAR SPECIAL TESTS:  Straight leg raise test: Negative and Slump test: Negative  FUNCTIONAL TESTS:  03/16/23 350 feet - around 300 feet you had increase in pain and started use cane at this point.  Uses B UE to transition from sit to stand, holds breath with bed mobilities  GAIT: Distance walked: 25 ft in clinic Assistive device utilized: Single point cane Level of assistance: Modified independence Comments: wide BOS hips ER - limited trunk and hip ROM   TODAY'S TREATMENT:                                                                                                                              DATE:   03/23/2023  Therapeutic Exercise:  Aerobic: Supine: groin butterfly stretch  5" holds 2 minute bouts , hip add iso 5" hlds 2 minutes, PROM B hips in all directions - tolerated well 10 minutes, FABER stretch too difficult stopped, hip IR x2 rounds of 1 minute bouts B S/l: clamshell 2x10 B, reverse clamshells 2x10 B  Seated: piriformis stretch with strap x3 30" holds B  Standing: Neuromuscular Re-education: Manual Therapy:   Therapeutic Activity: Self Care: Trigger Point Dry Needling:  Modalities: thermotherapy to cervical spine during supine interventions   PATIENT EDUCATION:  Education details: on HEP Person educated: Patient Education method: Explanation, Demonstration, and Handouts Education comprehension: verbalized understanding   HOME EXERCISE PROGRAM: [id number]  ASSESSMENT:  CLINICAL IMPRESSION: 03/23/2023 Session focused on advancement of exercises which was tolerated well. Added cervical heat secondary to complaints of pain and this was tolerated well with reports of reduced pain afterwards. No pain noted end of session. Fatigue in legs noted. Added new exercises to HEP. Will continue with current POC as tolerated.    Eval: Patient presents to physical  therapy with complaints of chronic low back pain with history of lumbar surgery in 2020.  Patient presents with significant limitations in hip range of motion's and compensatory strategies using groin muscles for ambulation and lower extremity movements.  Educated patient current presentation as well as plan moving forward.  Patient also has deficits with transitional movements and has expressed concerns about safely getting on and off the floor due to strength and low back pain.  Will incorporate balance and lower extremity strengthening  to help improve with these functional tasks at home and in the community.  Patient would greatly benefit from skilled PT to reduce pain and improve transitional movements.  OBJECTIVE IMPAIRMENTS: decreased activity tolerance, decreased knowledge of use of DME, decreased mobility, difficulty walking, decreased ROM, decreased strength, improper body mechanics, postural dysfunction, and pain.   ACTIVITY LIMITATIONS: lifting, sitting, standing, squatting, stairs, transfers, bed mobility, and locomotion level  PARTICIPATION LIMITATIONS: cleaning, community activity, and yard work  PERSONAL FACTORS: Age, Fitness, and 1-2 comorbidities: lumbar surgery, dementia   are also affecting patient's functional outcome.   REHAB POTENTIAL: Good  CLINICAL DECISION MAKING: Stable/uncomplicated  EVALUATION COMPLEXITY: Low   GOALS: Goals reviewed with patient? yes  SHORT TERM GOALS: Target date: 04/20/2023  Patient will be independent in self management strategies to improve quality of life and functional outcomes. Baseline: New Program Goal status: INITIAL  2.  Patient will report at least 25% improvement in overall symptoms and/or function to demonstrate improved functional mobility Baseline: 0% better Goal status: INITIAL  3.  Patient will demonstrate at least 10 degrees of hip IR to improve hip mobility. Baseline: see above Goal status: INITIAL  4.  Patient will  demonstrate painfree lumbar ROM in all directions. Baseline: painful Goal status: INITIAL    LONG TERM GOALS: Target date: 06/01/2023  Patient will report at least 50% improvement in overall symptoms and/or function to demonstrate improved functional mobility Baseline: 0% better Goal status: INITIAL  2.  Patient will improve score on FOTO outcomes measure to projected score to demonstrate overall improved function and QOL Baseline: see above Goal status: INITIAL  3.  Patient will be able to transition from sit to stand without UE to demonstrate improved LE strength.  Baseline: unable - uses hands Goal status: INITIAL    PLAN:  PT FREQUENCY: 1-2x/week for a total of 12 visits   PT DURATION: 12 weeks  PLANNED INTERVENTIONS: Therapeutic exercises, Therapeutic activity, Neuromuscular re-education, Balance training, Gait training, Patient/Family education, Self Care, Joint mobilization, Joint manipulation, Stair training, Vestibular training, Canalith repositioning, Orthotic/Fit training, Prosthetic training, DME instructions, Aquatic Therapy, Dry Needling, Electrical stimulation, Spinal manipulation, Spinal mobilization, Cryotherapy, Moist heat, Taping, Traction, Ultrasound, Ionotophoresis 4mg /ml Dexamethasone, Manual therapy, and Re-evaluation.   PLAN FOR NEXT SESSION:  percussion gun, f/u with walking program. hip and lumbar ROM , isometrics, table strengthening, breath work, work on Secretary/administrator  2:36 PM, 03/23/23 Tereasa Coop, DPT Physical Therapy with [facility] Lory

## 2023-03-30 ENCOUNTER — Encounter: Payer: Self-pay | Admitting: Physical Therapy

## 2023-03-30 ENCOUNTER — Ambulatory Visit: Payer: Medicare PPO | Admitting: Physical Therapy

## 2023-03-30 DIAGNOSIS — M6281 Muscle weakness (generalized): Secondary | ICD-10-CM | POA: Diagnosis not present

## 2023-03-30 DIAGNOSIS — M5416 Radiculopathy, lumbar region: Secondary | ICD-10-CM

## 2023-03-30 NOTE — Therapy (Signed)
OUTPATIENT PHYSICAL THERAPY THORACOLUMBAR TREATMENT   Patient Name: Shannon Garner MRN: [id number] DOB:Jun 13, 1951, 72 y.o., female Today's Date: 03/30/2023  END OF SESSION:  PT End of Session - 03/30/23 1348     Visit Number 5    Number of Visits 12    Date for PT Re-Evaluation 06/01/23    Authorization Type Francine Graven - auth for 12 visits from 9/23 to 12/16    Authorization - Visit Number 5    Authorization - Number of Visits 12    Progress Note Due on Visit 10    PT Start Time 1348    PT Stop Time 1426    PT Time Calculation (min) 38 min    Activity Tolerance Patient tolerated treatment well    Behavior During Therapy WFL for tasks assessed/performed             Past Medical History:  Diagnosis Date   Acquired cyst of kidney    Cysts on outside & inside of kidneys   Allergy    Anxiety    Arthritis    Asthma    Bronchitis, not specified as acute or chronic    Cancer (HCC)    skin cancer   Cellulitis and abscess of buttock    Cervicalgia    Depression    Diabetes mellitus    Hyperlipidemia    Hypertension    Insomnia    Leg weakness, bilateral    Lumbago    Memory difficulty    Migraine without aura, without mention of intractable migraine without mention of status migrainosus    Osteoarthrosis, unspecified whether generalized or localized, unspecified site    Pain in joint, lower leg    Pain in joint, shoulder region    Proteinuria    Past Surgical History:  Procedure Laterality Date   ABDOMINAL HYSTERECTOMY     Bone Spurs Removed Left    Left Heel   CARPAL TUNNEL RELEASE     rt/lt   CATARACT EXTRACTION     CERVICAL FUSION  2009   COLONOSCOPY     Corrective rectal surgery     CYSTECTOMY     On vertebrae. Rods put in as well   EYE SURGERY     lt cataract   FOOT SURGERY     HERNIA REPAIR  12/02/2011   hypercholesterol     insomnia     KIDNEY SURGERY     cysts on right kidney   LUMBAR LAMINECTOMY/DECOMPRESSION MICRODISCECTOMY Right 10/05/2018    Procedure: Microdiscectomy - right - L2-L3;  Surgeon: Julio Sicks, MD;  Location: [facility] OR;  Service: Neurosurgery;  Laterality: Right;  Microdiscectomy - right - L2-L3   lumbar radiculopathy     SPINE SURGERY  2011   cysts on spine   TOTAL KNEE ARTHROPLASTY     Right knee   TUBAL LIGATION     UMBILICAL HERNIA REPAIR  12/02/2011   Procedure: HERNIA REPAIR UMBILICAL ADULT;  Surgeon: Wilmon Arms. Corliss Skains, MD;  Location: [facility];  Service: General;  Laterality: N/A;  Umbilical hernia repair with mesh   Patient Active Problem List   Diagnosis Date Noted   Lumbar radiculopathy 10/05/2018   Umbilical hernia 11/12/2011   Abdominal pain, lower 10/28/2011    PCP: Richmond Campbell, PA  REFERRING PROVIDER: Julio Sicks, MD  REFERRING DIAG: M43.10 (ICD-10-CM) - Spondylolisthesis, site unspecified  Rationale for Evaluation and Treatment: Rehabilitation  THERAPY DIAG:  Lumbar radiculopathy  Muscle weakness (generalized)  ONSET DATE: progressively  worsened  over the last couple years   SUBJECTIVE:                                                                                                                                                                                           SUBJECTIVE STATEMENT: 03/30/2023 Reports her back was a little pain off and on and when it hurt she tried the exercises and it helped. Currently her neck bothers.  Eval: States her pain has been really bad. States that she went to PT before and that didn't help. States that her left groin causes soreness and sets off the pain. States she has pain and difficulties with yard work. States she can stand and move for about 10-15 on a bad day and then she has to sit. But if she sits too long she has pain. Reports pain is in the left groin too. Reports memory problems at baseline.  States she uses a cane for her balance and back pain. Reports it is difficult to get off the floor if she falls but she hasn't  fallen in a while.  PERTINENT HISTORY:  DB, stage 3 kidney disease, depression, recent memory changes,  L2-3 10/05/18 right microdisectomy, R TKA,   PAIN:  Are you having pain? Yes: NPRS scale: 3/10 Pain location: neck Pain description: dull/achy/tight Aggravating factors: prolonged positioning, moving around,  Relieving factors: laying down   PRECAUTIONS: None  RED FLAGS: None   WEIGHT BEARING RESTRICTIONS: No  FALLS:  Has patient fallen in last 6 months? No (was falling prior to the surgery)  LIVING ENVIRONMENT: Lives with: lives alone Lives in: House/apartment Stairs: Yes: External: 3 steps; none Has following equipment at home: Single point cane, Shower bench, and Grab bars  OCCUPATION: retired   PLOF: Independent with basic ADLs  PATIENT GOALS: increase mobility and reduce pain , work on floor transfer strength  OBJECTIVE:   DIAGNOSTIC FINDINGS:  None on file   PATIENT SURVEYS:  FOTO 40%  SCREENING FOR RED FLAGS: Bowel or bladder incontinence: No Spinal tumors: No Cauda equina syndrome: No Compression fracture: No Abdominal aneurysm: No  COGNITION: Overall cognitive status: Within functional limits for tasks assessed     SENSATION: Not tested    POSTURE: rounded shoulders, forward head, posterior pelvic tilt, and flexed trunk   PALPATION: Increased resting tone in glutes at rest - clenches glutes at rest- no tenderness to palpation noted  LUMBAR ROM:   AROM eval  Flexion 50% limited  Extension 90% limited  Right lateral flexion 75% limited*  Left lateral flexion 75% limited  Right rotation   Left rotation    (  Blank rows = not tested) *pain in low back    LE Measurements Lower Extremity Right EVAL Left EVAL   A/PROM MMT A/PROM MMT  Hip Flexion 100 3+ 95* 3*/**  Hip Extension      Hip Abduction      Hip Adduction      Hip Internal rotation 10  0   Hip External rotation 45  45   Knee Flexion  4  3+**  Knee Extension  4  3+**   Ankle Dorsiflexion  4  4  Ankle Plantarflexion      Ankle Inversion      Ankle Eversion       (Blank rows = not tested) * pain ** uses groin muscle   LUMBAR SPECIAL TESTS:  Straight leg raise test: Negative and Slump test: Negative  FUNCTIONAL TESTS:  03/16/23 350 feet - around 300 feet you had increase in pain and started use cane at this point.  Uses B UE to transition from sit to stand, holds breath with bed mobilities  GAIT: Distance walked: 25 ft in clinic Assistive device utilized: Single point cane Level of assistance: Modified independence Comments: wide BOS hips ER - limited trunk and hip ROM   TODAY'S TREATMENT:                                                                                                                              DATE:   03/30/2023  Therapeutic Exercise:  Aerobic: Supine: groin butterfly stretch  5" holds 2 minute bouts, bent knee fall ins 2 minutes, Piriformis stretch x3 30" holds B, LBP 2 minutes bridge 2x10, STS x15, windshield wiper's 2 minutes bilaterally, hip adduction isometrics 2 minutes, review of HEP    Standing: Neuromuscular Re-education: Manual Therapy:   Therapeutic Activity: Self Care: Trigger Point Dry Needling:  Modalities: thermotherapy to cervical spine during supine interventions   PATIENT EDUCATION:  Education details: on HEP, on Mg in foods to help with cramps, about MD f/u prior to starting a supplement and to just add food  Person educated: Patient Education method: Explanation, Demonstration, and Handouts Education comprehension: verbalized understanding   HOME EXERCISE PROGRAM: [id number]  ASSESSMENT:  CLINICAL IMPRESSION: 03/30/2023 Session focused on continued progression of exercises as well as addition of sit to stand.  Tolerated all exercises well as well as heat on neck which helped reduce neck pain.  Patient is doing very well and reporting less and less pain and improving in strength and  mobility.  Discussed foods with magnesium secondary to muscle cramping and patient inquiry about magnesium.  Discussed talking to primary care provider prior to starting any supplements.  Overall patient doing well we will continue to benefit from skilled PT at this time.   Eval: Patient presents to physical therapy with complaints of chronic low back pain with history of lumbar surgery in 2020.  Patient presents with significant limitations in hip range of motion's and compensatory  strategies using groin muscles for ambulation and lower extremity movements.  Educated patient current presentation as well as plan moving forward.  Patient also has deficits with transitional movements and has expressed concerns about safely getting on and off the floor due to strength and low back pain.  Will incorporate balance and lower extremity strengthening to help improve with these functional tasks at home and in the community.  Patient would greatly benefit from skilled PT to reduce pain and improve transitional movements.  OBJECTIVE IMPAIRMENTS: decreased activity tolerance, decreased knowledge of use of DME, decreased mobility, difficulty walking, decreased ROM, decreased strength, improper body mechanics, postural dysfunction, and pain.   ACTIVITY LIMITATIONS: lifting, sitting, standing, squatting, stairs, transfers, bed mobility, and locomotion level  PARTICIPATION LIMITATIONS: cleaning, community activity, and yard work  PERSONAL FACTORS: Age, Fitness, and 1-2 comorbidities: lumbar surgery, dementia   are also affecting patient's functional outcome.   REHAB POTENTIAL: Good  CLINICAL DECISION MAKING: Stable/uncomplicated  EVALUATION COMPLEXITY: Low   GOALS: Goals reviewed with patient? yes  SHORT TERM GOALS: Target date: 04/20/2023  Patient will be independent in self management strategies to improve quality of life and functional outcomes. Baseline: New Program Goal status: INITIAL  2.  Patient  will report at least 25% improvement in overall symptoms and/or function to demonstrate improved functional mobility Baseline: 0% better Goal status: INITIAL  3.  Patient will demonstrate at least 10 degrees of hip IR to improve hip mobility. Baseline: see above Goal status: INITIAL  4.  Patient will demonstrate painfree lumbar ROM in all directions. Baseline: painful Goal status: INITIAL    LONG TERM GOALS: Target date: 06/01/2023  Patient will report at least 50% improvement in overall symptoms and/or function to demonstrate improved functional mobility Baseline: 0% better Goal status: INITIAL  2.  Patient will improve score on FOTO outcomes measure to projected score to demonstrate overall improved function and QOL Baseline: see above Goal status: INITIAL  3.  Patient will be able to transition from sit to stand without UE to demonstrate improved LE strength.  Baseline: unable - uses hands Goal status: INITIAL    PLAN:  PT FREQUENCY: 1-2x/week for a total of 12 visits   PT DURATION: 12 weeks  PLANNED INTERVENTIONS: Therapeutic exercises, Therapeutic activity, Neuromuscular re-education, Balance training, Gait training, Patient/Family education, Self Care, Joint mobilization, Joint manipulation, Stair training, Vestibular training, Canalith repositioning, Orthotic/Fit training, Prosthetic training, DME instructions, Aquatic Therapy, Dry Needling, Electrical stimulation, Spinal manipulation, Spinal mobilization, Cryotherapy, Moist heat, Taping, Traction, Ultrasound, Ionotophoresis 4mg /ml Dexamethasone, Manual therapy, and Re-evaluation.   PLAN FOR NEXT SESSION:  percussion gun, f/u with walking program. hip and lumbar ROM , isometrics, table strengthening, breath work, work on floor transfer strength  3:33 PM, 03/30/23 Tereasa Coop, DPT Physical Therapy with Dolores Lory

## 2023-04-06 ENCOUNTER — Ambulatory Visit: Payer: Medicare PPO | Admitting: Physical Therapy

## 2023-04-06 ENCOUNTER — Encounter: Payer: Self-pay | Admitting: Physical Therapy

## 2023-04-06 DIAGNOSIS — M6281 Muscle weakness (generalized): Secondary | ICD-10-CM | POA: Diagnosis not present

## 2023-04-06 DIAGNOSIS — M5416 Radiculopathy, lumbar region: Secondary | ICD-10-CM | POA: Diagnosis not present

## 2023-04-06 NOTE — Therapy (Signed)
OUTPATIENT PHYSICAL THERAPY THORACOLUMBAR TREATMENT   Patient Name: Shannon Garner MRN: [id number] DOB:[birth year], 72 y.o., female Today's Date: 04/06/2023  END OF SESSION:  PT End of Session - 04/06/23 1352     Visit Number 6    Number of Visits 12    Date for PT Re-Evaluation 06/01/23    Authorization Type Francine Graven - auth for 12 visits from 9/23 to 12/16    Authorization - Visit Number 6    Authorization - Number of Visits 12    Progress Note Due on Visit 10    PT Start Time 1352    PT Stop Time 1430    PT Time Calculation (min) 38 min    Activity Tolerance Patient tolerated treatment well    Behavior During Therapy WFL for tasks assessed/performed             Past Medical History:  Diagnosis Date   Acquired cyst of kidney    Cysts on outside & inside of kidneys   Allergy    Anxiety    Arthritis    Asthma    Bronchitis, not specified as acute or chronic    Cancer (HCC)    skin cancer   Cellulitis and abscess of buttock    Cervicalgia    Depression    Diabetes mellitus    Hyperlipidemia    Hypertension    Insomnia    Leg weakness, bilateral    Lumbago    Memory difficulty    Migraine without aura, without mention of intractable migraine without mention of status migrainosus    Osteoarthrosis, unspecified whether generalized or localized, unspecified site    Pain in joint, lower leg    Pain in joint, shoulder region    Proteinuria    Past Surgical History:  Procedure Laterality Date   ABDOMINAL HYSTERECTOMY     Bone Spurs Removed Left    Left Heel   CARPAL TUNNEL RELEASE     rt/lt   CATARACT EXTRACTION     CERVICAL FUSION  2009   COLONOSCOPY     Corrective rectal surgery     CYSTECTOMY     On vertebrae. Rods put in as well   EYE SURGERY     lt cataract   FOOT SURGERY     HERNIA REPAIR  12/02/2011   hypercholesterol     insomnia     KIDNEY SURGERY     cysts on right kidney   LUMBAR LAMINECTOMY/DECOMPRESSION MICRODISCECTOMY Right 10/05/2018    Procedure: Microdiscectomy - right - L2-L3;  Surgeon: Julio Sicks, MD;  Location: [facility];  Service: Neurosurgery;  Laterality: Right;  Microdiscectomy - right - L2-L3   lumbar radiculopathy     SPINE SURGERY  2011   cysts on spine   TOTAL KNEE ARTHROPLASTY     Right knee   TUBAL LIGATION     UMBILICAL HERNIA REPAIR  12/02/2011   Procedure: HERNIA REPAIR UMBILICAL ADULT;  Surgeon: Wilmon Arms. Corliss Skains, MD;  Location: [facility];  Service: General;  Laterality: N/A;  Umbilical hernia repair with mesh   Patient Active Problem List   Diagnosis Date Noted   Lumbar radiculopathy 10/05/2018   Umbilical hernia 11/12/2011   Abdominal pain, lower 10/28/2011    PCP: Richmond Campbell, PA  REFERRING PROVIDER: Julio Sicks, MD  REFERRING DIAG: M43.10 (ICD-10-CM) - Spondylolisthesis, site unspecified  Rationale for Evaluation and Treatment: Rehabilitation  THERAPY DIAG:  Lumbar radiculopathy  Muscle weakness (generalized)  ONSET DATE: progressively  worsened  over the last couple years   SUBJECTIVE:                                                                                                                                                                                           SUBJECTIVE STATEMENT: 04/06/2023 Neck is feeling good today and still slight pain in the back and hip.   Eval: States her pain has been really bad. States that she went to PT before and that didn't help. States that her left groin causes soreness and sets off the pain. States she has pain and difficulties with yard work. States she can stand and move for about 10-15 on a bad day and then she has to sit. But if she sits too long she has pain. Reports pain is in the left groin too. Reports memory problems at baseline.  States she uses a cane for her balance and back pain. Reports it is difficult to get off the floor if she falls but she hasn't fallen in a while.  PERTINENT HISTORY:  DB, stage 3 kidney  disease, depression, recent memory changes,  L2-3 10/05/18 right microdisectomy, R TKA,   PAIN:  Are you having pain? Yes: NPRS scale: 1/10 Pain location: neck Pain description: dull/achy/tight Aggravating factors: prolonged positioning, moving around,  Relieving factors: laying down   PRECAUTIONS: None  RED FLAGS: None   WEIGHT BEARING RESTRICTIONS: No  FALLS:  Has patient fallen in last 6 months? No (was falling prior to the surgery)  LIVING ENVIRONMENT: Lives with: lives alone Lives in: House/apartment Stairs: Yes: External: 3 steps; none Has following equipment at home: Single point cane, Shower bench, and Grab bars  OCCUPATION: retired   PLOF: Independent with basic ADLs  PATIENT GOALS: increase mobility and reduce pain , work on floor transfer strength  OBJECTIVE:   DIAGNOSTIC FINDINGS:  None on file   PATIENT SURVEYS:  FOTO 40%  SCREENING FOR RED FLAGS: Bowel or bladder incontinence: No Spinal tumors: No Cauda equina syndrome: No Compression fracture: No Abdominal aneurysm: No  COGNITION: Overall cognitive status: Within functional limits for tasks assessed     SENSATION: Not tested    POSTURE: rounded shoulders, forward head, posterior pelvic tilt, and flexed trunk   PALPATION: Increased resting tone in glutes at rest - clenches glutes at rest- no tenderness to palpation noted  LUMBAR ROM:   AROM eval  Flexion 50% limited  Extension 90% limited  Right lateral flexion 75% limited*  Left lateral flexion 75% limited  Right rotation   Left rotation    (Blank rows = not tested) *pain in low back  LE Measurements Lower Extremity Right EVAL Left EVAL   A/PROM MMT A/PROM MMT  Hip Flexion 100 3+ 95* 3*/**  Hip Extension      Hip Abduction      Hip Adduction      Hip Internal rotation 10  0   Hip External rotation 45  45   Knee Flexion  4  3+**  Knee Extension  4  3+**  Ankle Dorsiflexion  4  4  Ankle Plantarflexion      Ankle  Inversion      Ankle Eversion       (Blank rows = not tested) * pain ** uses groin muscle   LUMBAR SPECIAL TESTS:  Straight leg raise test: Negative and Slump test: Negative  FUNCTIONAL TESTS:  03/16/23 350 feet - around 300 feet you had increase in pain and started use cane at this point.  Uses B UE to transition from sit to stand, holds breath with bed mobilities  GAIT: Distance walked: 25 ft in clinic Assistive device utilized: Single point cane Level of assistance: Modified independence Comments: wide BOS hips ER - limited trunk and hip ROM   TODAY'S TREATMENT:                                                                                                                              DATE:   04/06/2023  Therapeutic Exercise:  Seated: hip abd stretch 2 minutes Supine: groin butterfly stretch  5" holds 2 minute bouts, bridges 2x10, hamstring iso on ball x2 1 minute, deadbugs iso - too challenging, hip flexion around large ball to reduce adductor activation - 3x10 B, bent knee fall ins 2 minutes,   Standing:4 inch steps - alternating gait pattern 5 minutes with 1-2 hand support on railing Neuromuscular Re-education: Manual Therapy:   Therapeutic Activity: Self Care: Trigger Point Dry Needling:  Modalities: thermotherapy to cervical spine during supine interventions   PATIENT EDUCATION:  Education details: on HEP, Person educated: Patient Education method: Explanation, Demonstration, and Handouts Education comprehension: verbalized understanding   HOME EXERCISE PROGRAM: [id number]  ASSESSMENT:  CLINICAL IMPRESSION: 04/06/2023 Continued to progress exercises as tolerated. Fatigue in legs noted. Initially slight cramping in left groin with hip flexion but this reduced with PT assist and use of large ball to reduce groin activity and keep legs in neutral. No new exercises added to HEP. Overall patient doing very well and will continue to benefit from skilled PT     Eval: Patient presents to physical therapy with complaints of chronic low back pain with history of lumbar surgery in 2020.  Patient presents with significant limitations in hip range of motion's and compensatory strategies using groin muscles for ambulation and lower extremity movements.  Educated patient current presentation as well as plan moving forward.  Patient also has deficits with transitional movements and has expressed concerns about safely getting on and off the floor due to strength and low back pain.  Will incorporate balance and lower extremity strengthening to help improve with these functional tasks at home and in the community.  Patient would greatly benefit from skilled PT to reduce pain and improve transitional movements.  OBJECTIVE IMPAIRMENTS: decreased activity tolerance, decreased knowledge of use of DME, decreased mobility, difficulty walking, decreased ROM, decreased strength, improper body mechanics, postural dysfunction, and pain.   ACTIVITY LIMITATIONS: lifting, sitting, standing, squatting, stairs, transfers, bed mobility, and locomotion level  PARTICIPATION LIMITATIONS: cleaning, community activity, and yard work  PERSONAL FACTORS: Age, Fitness, and 1-2 comorbidities: lumbar surgery, dementia   are also affecting patient's functional outcome.   REHAB POTENTIAL: Good  CLINICAL DECISION MAKING: Stable/uncomplicated  EVALUATION COMPLEXITY: Low   GOALS: Goals reviewed with patient? yes  SHORT TERM GOALS: Target date: 04/20/2023  Patient will be independent in self management strategies to improve quality of life and functional outcomes. Baseline: New Program Goal status: INITIAL  2.  Patient will report at least 25% improvement in overall symptoms and/or function to demonstrate improved functional mobility Baseline: 0% better Goal status: INITIAL  3.  Patient will demonstrate at least 10 degrees of hip IR to improve hip mobility. Baseline: see above Goal  status: INITIAL  4.  Patient will demonstrate painfree lumbar ROM in all directions. Baseline: painful Goal status: INITIAL    LONG TERM GOALS: Target date: 06/01/2023  Patient will report at least 50% improvement in overall symptoms and/or function to demonstrate improved functional mobility Baseline: 0% better Goal status: INITIAL  2.  Patient will improve score on FOTO outcomes measure to projected score to demonstrate overall improved function and QOL Baseline: see above Goal status: INITIAL  3.  Patient will be able to transition from sit to stand without UE to demonstrate improved LE strength.  Baseline: unable - uses hands Goal status: INITIAL    PLAN:  PT FREQUENCY: 1-2x/week for a total of 12 visits   PT DURATION: 12 weeks  PLANNED INTERVENTIONS: Therapeutic exercises, Therapeutic activity, Neuromuscular re-education, Balance training, Gait training, Patient/Family education, Self Care, Joint mobilization, Joint manipulation, Stair training, Vestibular training, Canalith repositioning, Orthotic/Fit training, Prosthetic training, DME instructions, Aquatic Therapy, Dry Needling, Electrical stimulation, Spinal manipulation, Spinal mobilization, Cryotherapy, Moist heat, Taping, Traction, Ultrasound, Ionotophoresis 4mg /ml Dexamethasone, Manual therapy, and Re-evaluation.   PLAN FOR NEXT SESSION:  percussion gun, f/u with walking program. hip and lumbar ROM , isometrics, table strengthening, breath work, work on floor transfer strength  2:33 PM, 04/06/23 Tereasa Coop, DPT Physical Therapy with [facility]

## 2023-04-13 ENCOUNTER — Ambulatory Visit: Payer: Medicare PPO | Admitting: Physical Therapy

## 2023-04-13 ENCOUNTER — Encounter: Payer: Self-pay | Admitting: Physical Therapy

## 2023-04-13 DIAGNOSIS — M5416 Radiculopathy, lumbar region: Secondary | ICD-10-CM | POA: Diagnosis not present

## 2023-04-13 DIAGNOSIS — M6281 Muscle weakness (generalized): Secondary | ICD-10-CM | POA: Diagnosis not present

## 2023-04-13 NOTE — Therapy (Signed)
OUTPATIENT PHYSICAL THERAPY THORACOLUMBAR TREATMENT  Progress Note Reporting Period 03/09/23 to 04/13/23  See note below for Objective Data and Assessment of Progress/Goals.      Patient Name: Shannon Garner MRN: [id number] DOB:July 18, 1950, 72 y.o., female Today's Date: 04/13/2023  END OF SESSION:  PT End of Session - 04/13/23 1349     Visit Number 7    Number of Visits 12    Date for PT Re-Evaluation 06/01/23    Authorization Type Francine Graven - auth for 12 visits from 9/23 to 12/16    Authorization - Visit Number 7    Authorization - Number of Visits 12    Progress Note Due on Visit 17    PT Start Time 1349    PT Stop Time 1427    PT Time Calculation (min) 38 min    Activity Tolerance Patient tolerated treatment well    Behavior During Therapy WFL for tasks assessed/performed             Past Medical History:  Diagnosis Date   Acquired cyst of kidney    Cysts on outside & inside of kidneys   Allergy    Anxiety    Arthritis    Asthma    Bronchitis, not specified as acute or chronic    Cancer (HCC)    skin cancer   Cellulitis and abscess of buttock    Cervicalgia    Depression    Diabetes mellitus    Hyperlipidemia    Hypertension    Insomnia    Leg weakness, bilateral    Lumbago    Memory difficulty    Migraine without aura, without mention of intractable migraine without mention of status migrainosus    Osteoarthrosis, unspecified whether generalized or localized, unspecified site    Pain in joint, lower leg    Pain in joint, shoulder region    Proteinuria    Past Surgical History:  Procedure Laterality Date   ABDOMINAL HYSTERECTOMY     Bone Spurs Removed Left    Left Heel   CARPAL TUNNEL RELEASE     rt/lt   CATARACT EXTRACTION     CERVICAL FUSION  2009   COLONOSCOPY     Corrective rectal surgery     CYSTECTOMY     On vertebrae. Rods put in as well   EYE SURGERY     lt cataract   FOOT SURGERY     HERNIA REPAIR  12/02/2011   hypercholesterol      insomnia     KIDNEY SURGERY     cysts on right kidney   LUMBAR LAMINECTOMY/DECOMPRESSION MICRODISCECTOMY Right 10/05/2018   Procedure: Microdiscectomy - right - L2-L3;  Surgeon: Julio Sicks, MD;  Location: [facility] OR;  Service: Neurosurgery;  Laterality: Right;  Microdiscectomy - right - L2-L3   lumbar radiculopathy     SPINE SURGERY  2011   cysts on spine   TOTAL KNEE ARTHROPLASTY     Right knee   TUBAL LIGATION     UMBILICAL HERNIA REPAIR  12/02/2011   Procedure: HERNIA REPAIR UMBILICAL ADULT;  Surgeon: Wilmon Arms. Corliss Skains, MD;  Location: [facility];  Service: General;  Laterality: N/A;  Umbilical hernia repair with mesh   Patient Active Problem List   Diagnosis Date Noted   Lumbar radiculopathy 10/05/2018   Umbilical hernia 11/12/2011   Abdominal pain, lower 10/28/2011    PCP: Richmond Campbell, PA  REFERRING PROVIDER: Julio Sicks, MD  REFERRING DIAG: M43.10 (ICD-10-CM) - Spondylolisthesis, site  unspecified  Rationale for Evaluation and Treatment: Rehabilitation  THERAPY DIAG:  Lumbar radiculopathy  Muscle weakness (generalized)  ONSET DATE: progressively worsened  over the last couple years   SUBJECTIVE:                                                                                                                                                                                           SUBJECTIVE STATEMENT: 04/13/2023 States she has a a tension headache as she been busy. States her back is better. Can get shoes and pants on easier now. Reports she feels 95% better since the start of PT  Eval: States her pain has been really bad. States that she went to PT before and that didn't help. States that her left groin causes soreness and sets off the pain. States she has pain and difficulties with yard work. States she can stand and move for about 10-15 on a bad day and then she has to sit. But if she sits too long she has pain. Reports pain is in the left groin too.  Reports memory problems at baseline.  States she uses a cane for her balance and back pain. Reports it is difficult to get off the floor if she falls but she hasn't fallen in a while.  PERTINENT HISTORY:  DB, stage 3 kidney disease, depression, recent memory changes,  L2-3 10/05/18 right microdisectomy, R TKA,   PAIN:  Are you having pain? Yes: NPRS scale: 4/10 Pain location: neck Pain description: dull/achy/tight Aggravating factors: prolonged positioning, moving around,  Relieving factors: laying down   PRECAUTIONS: None  RED FLAGS: None   WEIGHT BEARING RESTRICTIONS: No  FALLS:  Has patient fallen in last 6 months? No (was falling prior to the surgery)  LIVING ENVIRONMENT: Lives with: lives alone Lives in: House/apartment Stairs: Yes: External: 3 steps; none Has following equipment at home: Single point cane, Shower bench, and Grab bars  OCCUPATION: retired   PLOF: Independent with basic ADLs  PATIENT GOALS: increase mobility and reduce pain , work on floor transfer strength  OBJECTIVE:   DIAGNOSTIC FINDINGS:  None on file   PATIENT SURVEYS:  FOTO 40% , 04/13/23 73%  SCREENING FOR RED FLAGS: Bowel or bladder incontinence: No Spinal tumors: No Cauda equina syndrome: No Compression fracture: No Abdominal aneurysm: No  COGNITION: Overall cognitive status: Within functional limits for tasks assessed     SENSATION: Not tested    POSTURE: rounded shoulders, forward head, posterior pelvic tilt, and flexed trunk   PALPATION: Increased resting tone in glutes at rest - clenches glutes at rest- no tenderness to palpation noted  LUMBAR ROM:  AROM 04/13/23  Flexion 75% limited  Extension 90% limited  Right lateral flexion 25% limited  Left lateral flexion 25% limited  Right rotation   Left rotation    (Blank rows = not tested) *pain in low back    LE Measurements Lower Extremity Right 10/28 Left 10/28   A/PROM MMT A/PROM MMT  Hip Flexion 105 4-  105 4-  Hip Extension      Hip Abduction      Hip Adduction      Hip Internal rotation 10  5   Hip External rotation 45  30   Knee Flexion  4+  4  Knee Extension  4+  4  Ankle Dorsiflexion  4+  4+  Ankle Plantarflexion      Ankle Inversion      Ankle Eversion       (Blank rows = not tested) * pain ** uses groin muscle     FUNCTIONAL TESTS:  10/28 no UE to transition from sit to stand,    GAIT: Distance walked: 25 ft in clinic Assistive device utilized: Single point cane Level of assistance: Modified independence Comments: wide BOS hips ER - limited trunk and hip ROM   TODAY'S TREATMENT:                                                                                                                              DATE:   04/13/2023  Therapeutic Exercise:  Objective measures updated  Seated: hip abd stretch 2 minute, STS 4x5 slow and controlled Supine: groin butterfly stretch  5" holds 2 minute bouts, bridges 2x10, hip IR 2 minutes    Prone: hamstring curls 5" holds 2 minutes alternating  Standing:4 inch steps - alternating gait pattern 5 minutes with 1-2 hand support on railing Neuromuscular Re-education: Manual Therapy:   Therapeutic Activity: Self Care: Trigger Point Dry Needling:  Modalities: thermotherapy to cervical spine during supine interventions   PATIENT EDUCATION:  Education details: on HEP, ON PROGRESS made  Person educated: Patient Education method: Explanation, Demonstration, and Handouts Education comprehension: verbalized understanding   HOME EXERCISE PROGRAM: [id number]  ASSESSMENT:  CLINICAL IMPRESSION: 04/13/2023 Progress note performed on this date. Overall patient doing well and has met all but one short term goal and all long term goals met. Reviewed POC and HEP with progress as well as FOTO score. Will continue with current POC working on strength and progressing to floor transfer as able.   Eval: Patient presents to physical therapy  with complaints of chronic low back pain with history of lumbar surgery in 2020.  Patient presents with significant limitations in hip range of motion's and compensatory strategies using groin muscles for ambulation and lower extremity movements.  Educated patient current presentation as well as plan moving forward.  Patient also has deficits with transitional movements and has expressed concerns about safely getting on and off the floor due to strength and low back pain.  Will incorporate  balance and lower extremity strengthening to help improve with these functional tasks at home and in the community.  Patient would greatly benefit from skilled PT to reduce pain and improve transitional movements.  OBJECTIVE IMPAIRMENTS: decreased activity tolerance, decreased knowledge of use of DME, decreased mobility, difficulty walking, decreased ROM, decreased strength, improper body mechanics, postural dysfunction, and pain.   ACTIVITY LIMITATIONS: lifting, sitting, standing, squatting, stairs, transfers, bed mobility, and locomotion level  PARTICIPATION LIMITATIONS: cleaning, community activity, and yard work  PERSONAL FACTORS: Age, Fitness, and 1-2 comorbidities: lumbar surgery, dementia   are also affecting patient's functional outcome.   REHAB POTENTIAL: Good  CLINICAL DECISION MAKING: Stable/uncomplicated  EVALUATION COMPLEXITY: Low   GOALS: Goals reviewed with patient? yes  SHORT TERM GOALS: Target date: 04/20/2023  Patient will be independent in self management strategies to improve quality of life and functional outcomes. Baseline: New Program Goal status: MET  2.  Patient will report at least 25% improvement in overall symptoms and/or function to demonstrate improved functional mobility Baseline: 0% better Goal status: MET  3.  Patient will demonstrate at least 10 degrees of hip IR to improve hip mobility. Baseline: see above Goal status: PROGRESSING  4.  Patient will demonstrate  painfree lumbar ROM in all directions. Baseline: painful Goal status: MET    LONG TERM GOALS: Target date: 06/01/2023  Patient will report at least 50% improvement in overall symptoms and/or function to demonstrate improved functional mobility Baseline: 0% better Goal status: MET  2.  Patient will improve score on FOTO outcomes measure to projected score to demonstrate overall improved function and QOL Baseline: see above Goal status: MET  3.  Patient will be able to transition from sit to stand without UE to demonstrate improved LE strength.  Baseline: unable - uses hands Goal status: MET    PLAN:  PT FREQUENCY: 1-2x/week for a total of 12 visits   PT DURATION: 12 weeks  PLANNED INTERVENTIONS: Therapeutic exercises, Therapeutic activity, Neuromuscular re-education, Balance training, Gait training, Patient/Family education, Self Care, Joint mobilization, Joint manipulation, Stair training, Vestibular training, Canalith repositioning, Orthotic/Fit training, Prosthetic training, DME instructions, Aquatic Therapy, Dry Needling, Electrical stimulation, Spinal manipulation, Spinal mobilization, Cryotherapy, Moist heat, Taping, Traction, Ultrasound, Ionotophoresis 4mg /ml Dexamethasone, Manual therapy, and Re-evaluation.   PLAN FOR NEXT SESSION:  focus on strength- progress to floor transfers  2:31 PM, 04/13/23 Tereasa Coop, DPT Physical Therapy with [facility]

## 2023-04-27 ENCOUNTER — Encounter: Payer: Medicare PPO | Admitting: Physical Therapy

## 2023-05-04 ENCOUNTER — Ambulatory Visit: Payer: Medicare PPO | Admitting: Physical Therapy

## 2023-05-04 ENCOUNTER — Encounter: Payer: Self-pay | Admitting: Physical Therapy

## 2023-05-04 DIAGNOSIS — M5416 Radiculopathy, lumbar region: Secondary | ICD-10-CM | POA: Diagnosis not present

## 2023-05-04 DIAGNOSIS — M6281 Muscle weakness (generalized): Secondary | ICD-10-CM

## 2023-05-04 NOTE — Therapy (Signed)
OUTPATIENT PHYSICAL THERAPY THORACOLUMBAR TREATMENT  Progress Note Reporting Period 03/09/23 to 04/13/23  See note below for Objective Data and Assessment of Progress/Goals.      Patient Name: Shannon Garner MRN: [id number] DOB:Aug 08, 1950, 72 y.o., female Today's Date: 05/04/2023  END OF SESSION:  PT End of Session - 05/04/23 1302     Visit Number 8    Number of Visits 12    Date for PT Re-Evaluation 06/01/23    Authorization Type Francine Graven - auth for 12 visits from 9/23 to 12/16    Authorization - Visit Number 8    Authorization - Number of Visits 12    Progress Note Due on Visit 17    PT Start Time 1303    PT Stop Time 1341    PT Time Calculation (min) 38 min    Activity Tolerance Patient tolerated treatment well    Behavior During Therapy WFL for tasks assessed/performed             Past Medical History:  Diagnosis Date   Acquired cyst of kidney    Cysts on outside & inside of kidneys   Allergy    Anxiety    Arthritis    Asthma    Bronchitis, not specified as acute or chronic    Cancer (HCC)    skin cancer   Cellulitis and abscess of buttock    Cervicalgia    Depression    Diabetes mellitus    Hyperlipidemia    Hypertension    Insomnia    Leg weakness, bilateral    Lumbago    Memory difficulty    Migraine without aura, without mention of intractable migraine without mention of status migrainosus    Osteoarthrosis, unspecified whether generalized or localized, unspecified site    Pain in joint, lower leg    Pain in joint, shoulder region    Proteinuria    Past Surgical History:  Procedure Laterality Date   ABDOMINAL HYSTERECTOMY     Bone Spurs Removed Left    Left Heel   CARPAL TUNNEL RELEASE     rt/lt   CATARACT EXTRACTION     CERVICAL FUSION  2009   COLONOSCOPY     Corrective rectal surgery     CYSTECTOMY     On vertebrae. Rods put in as well   EYE SURGERY     lt cataract   FOOT SURGERY     HERNIA REPAIR  12/02/2011   hypercholesterol      insomnia     KIDNEY SURGERY     cysts on right kidney   LUMBAR LAMINECTOMY/DECOMPRESSION MICRODISCECTOMY Right 10/05/2018   Procedure: Microdiscectomy - right - L2-L3;  Surgeon: Julio Sicks, MD;  Location: [facility] OR;  Service: Neurosurgery;  Laterality: Right;  Microdiscectomy - right - L2-L3   lumbar radiculopathy     SPINE SURGERY  2011   cysts on spine   TOTAL KNEE ARTHROPLASTY     Right knee   TUBAL LIGATION     UMBILICAL HERNIA REPAIR  12/02/2011   Procedure: HERNIA REPAIR UMBILICAL ADULT;  Surgeon: Wilmon Arms. Corliss Skains, MD;  Location: [facility];  Service: General;  Laterality: N/A;  Umbilical hernia repair with mesh   Patient Active Problem List   Diagnosis Date Noted   Lumbar radiculopathy 10/05/2018   Umbilical hernia 11/12/2011   Abdominal pain, lower 10/28/2011    PCP: Richmond Campbell, PA  REFERRING PROVIDER: Julio Sicks, MD  REFERRING DIAG: M43.10 (ICD-10-CM) - Spondylolisthesis, site  unspecified  Rationale for Evaluation and Treatment: Rehabilitation  THERAPY DIAG:  Lumbar radiculopathy  Muscle weakness (generalized)  ONSET DATE: progressively worsened  over the last couple years   SUBJECTIVE:                                                                                                                                                                                           SUBJECTIVE STATEMENT: 05/04/2023 States her shoulders have been tight R>L but back is good.   Eval: States her pain has been really bad. States that she went to PT before and that didn't help. States that her left groin causes soreness and sets off the pain. States she has pain and difficulties with yard work. States she can stand and move for about 10-15 on a bad day and then she has to sit. But if she sits too long she has pain. Reports pain is in the left groin too. Reports memory problems at baseline.  States she uses a cane for her balance and back pain. Reports it is  difficult to get off the floor if she falls but she hasn't fallen in a while.  PERTINENT HISTORY:  DB, stage 3 kidney disease, depression, recent memory changes,  L2-3 10/05/18 right microdisectomy, R TKA,   PAIN:  Are you having pain? Yes: NPRS scale: 4/10 Pain location: right shoulder Pain description: dull/achy/throbbing Aggravating factors: prolonged positioning, moving around,  Relieving factors: laying down   PRECAUTIONS: None  RED FLAGS: None   WEIGHT BEARING RESTRICTIONS: No  FALLS:  Has patient fallen in last 6 months? No (was falling prior to the surgery)  LIVING ENVIRONMENT: Lives with: lives alone Lives in: House/apartment Stairs: Yes: External: 3 steps; none Has following equipment at home: Single point cane, Shower bench, and Grab bars  OCCUPATION: retired   PLOF: Independent with basic ADLs  PATIENT GOALS: increase mobility and reduce pain , work on floor transfer strength  OBJECTIVE:   DIAGNOSTIC FINDINGS:  None on file   PATIENT SURVEYS:  FOTO 40% , 04/13/23 73%  SCREENING FOR RED FLAGS: Bowel or bladder incontinence: No Spinal tumors: No Cauda equina syndrome: No Compression fracture: No Abdominal aneurysm: No  COGNITION: Overall cognitive status: Within functional limits for tasks assessed     SENSATION: Not tested    POSTURE: rounded shoulders, forward head, posterior pelvic tilt, and flexed trunk   PALPATION: Increased resting tone in glutes at rest - clenches glutes at rest- no tenderness to palpation noted  LUMBAR ROM:   AROM 04/13/23  Flexion 75% limited  Extension 90% limited  Right lateral flexion 25% limited  Left lateral  flexion 25% limited  Right rotation   Left rotation    (Blank rows = not tested) *pain in low back    LE Measurements Lower Extremity Right 10/28 Left 10/28   A/PROM MMT A/PROM MMT  Hip Flexion 105 4- 105 4-  Hip Extension      Hip Abduction      Hip Adduction      Hip Internal rotation 10   5   Hip External rotation 45  30   Knee Flexion  4+  4  Knee Extension  4+  4  Ankle Dorsiflexion  4+  4+  Ankle Plantarflexion      Ankle Inversion      Ankle Eversion       (Blank rows = not tested) * pain ** uses groin muscle     FUNCTIONAL TESTS:  10/28 no UE to transition from sit to stand,    GAIT: Distance walked: 25 ft in clinic Assistive device utilized: Single point cane Level of assistance: Modified independence Comments: wide BOS hips ER - limited trunk and hip ROM   TODAY'S TREATMENT:                                                                                                                              DATE:   05/04/2023  Therapeutic Exercise: Seated:   STS 4x5 slow and controlled, LAQs 2x5 5" holds B Supine: LTR 2 minutes, groin butterfly stretch  5" holds 2 minute bouts, bridges 2x10, hip IR 2 minutes, SLR 2x8 B, shoulder ER 2 minutes, shoulder ER B 2 minutes    Prone: hamstring curls 5" holds 2 minutes alternating  Standing: Neuromuscular Re-education: Manual Therapy:   Therapeutic Activity: prone to knees with PT assist 10 minutes to help with floor transfers - performed on bed. Self Care: Trigger Point Dry Needling:  Modalities: thermotherapy to right shoulder during supine interventions   PATIENT EDUCATION:  Education details: on HEP Person educated: Patient Education method: Programmer, multimedia, Demonstration, and Handouts Education comprehension: verbalized understanding   HOME EXERCISE PROGRAM: [id number]  ASSESSMENT:  CLINICAL IMPRESSION: 05/04/2023 Right shoulder bothering patient on this date, added heat to the shoulder and performed 1 stretch to help with symptoms, this was tolerated very well reporting significantly less pain in this area end of session.  Prone to on knees transitional movement with PT assist to help with for transfers.  Did not add this to home exercise program as patient needed assistance for safety and exercise.   Overall patient doing very well we will continue to benefit from skilled PT at this time.   Eval: Patient presents to physical therapy with complaints of chronic low back pain with history of lumbar surgery in 2020.  Patient presents with significant limitations in hip range of motion's and compensatory strategies using groin muscles for ambulation and lower extremity movements.  Educated patient current presentation as well as plan moving forward.  Patient also has deficits  with transitional movements and has expressed concerns about safely getting on and off the floor due to strength and low back pain.  Will incorporate balance and lower extremity strengthening to help improve with these functional tasks at home and in the community.  Patient would greatly benefit from skilled PT to reduce pain and improve transitional movements.  OBJECTIVE IMPAIRMENTS: decreased activity tolerance, decreased knowledge of use of DME, decreased mobility, difficulty walking, decreased ROM, decreased strength, improper body mechanics, postural dysfunction, and pain.   ACTIVITY LIMITATIONS: lifting, sitting, standing, squatting, stairs, transfers, bed mobility, and locomotion level  PARTICIPATION LIMITATIONS: cleaning, community activity, and yard work  PERSONAL FACTORS: Age, Fitness, and 1-2 comorbidities: lumbar surgery, dementia   are also affecting patient's functional outcome.   REHAB POTENTIAL: Good  CLINICAL DECISION MAKING: Stable/uncomplicated  EVALUATION COMPLEXITY: Low   GOALS: Goals reviewed with patient? yes  SHORT TERM GOALS: Target date: 04/20/2023  Patient will be independent in self management strategies to improve quality of life and functional outcomes. Baseline: New Program Goal status: MET  2.  Patient will report at least 25% improvement in overall symptoms and/or function to demonstrate improved functional mobility Baseline: 0% better Goal status: MET  3.  Patient will demonstrate  at least 10 degrees of hip IR to improve hip mobility. Baseline: see above Goal status: PROGRESSING  4.  Patient will demonstrate painfree lumbar ROM in all directions. Baseline: painful Goal status: MET    LONG TERM GOALS: Target date: 06/01/2023  Patient will report at least 50% improvement in overall symptoms and/or function to demonstrate improved functional mobility Baseline: 0% better Goal status: MET  2.  Patient will improve score on FOTO outcomes measure to projected score to demonstrate overall improved function and QOL Baseline: see above Goal status: MET  3.  Patient will be able to transition from sit to stand without UE to demonstrate improved LE strength.  Baseline: unable - uses hands Goal status: MET    PLAN:  PT FREQUENCY: 1-2x/week for a total of 12 visits   PT DURATION: 12 weeks  PLANNED INTERVENTIONS: Therapeutic exercises, Therapeutic activity, Neuromuscular re-education, Balance training, Gait training, Patient/Family education, Self Care, Joint mobilization, Joint manipulation, Stair training, Vestibular training, Canalith repositioning, Orthotic/Fit training, Prosthetic training, DME instructions, Aquatic Therapy, Dry Needling, Electrical stimulation, Spinal manipulation, Spinal mobilization, Cryotherapy, Moist heat, Taping, Traction, Ultrasound, Ionotophoresis 4mg /ml Dexamethasone, Manual therapy, and Re-evaluation.   PLAN FOR NEXT SESSION:  focus on strength- progress to floor transfers  1:44 PM, 05/04/23 Tereasa Coop, DPT Physical Therapy with [facility]

## 2023-05-11 ENCOUNTER — Encounter: Payer: Medicare PPO | Admitting: Physical Therapy

## 2023-05-18 ENCOUNTER — Encounter: Payer: Medicare PPO | Admitting: Physical Therapy

## 2023-05-25 ENCOUNTER — Ambulatory Visit: Payer: Medicare PPO | Admitting: Physical Therapy

## 2023-05-25 ENCOUNTER — Encounter: Payer: Self-pay | Admitting: Physical Therapy

## 2023-05-25 DIAGNOSIS — M6281 Muscle weakness (generalized): Secondary | ICD-10-CM | POA: Diagnosis not present

## 2023-05-25 DIAGNOSIS — M5416 Radiculopathy, lumbar region: Secondary | ICD-10-CM | POA: Diagnosis not present

## 2023-05-25 NOTE — Therapy (Addendum)
OUTPATIENT PHYSICAL THERAPY THORACOLUMBAR TREATMENT PHYSICAL THERAPY DISCHARGE SUMMARY  Visits from Start of Care: 9  Current functional level related to goals / functional outcomes: All but one short term goal met   Remaining deficits: See below   Education / Equipment: See below   Patient agrees to discharge. Patient goals were partially met. Patient is being discharged due to being pleased with the current functional level.  8:36 AM, 06/09/23 Tereasa Coop, DPT Physical Therapy with [place]      Patient Name: Shannon Garner MRN: [id number] DOB:[birth year], 72 y.o., female Today's Date: 05/25/2023  END OF SESSION:  PT End of Session - 05/25/23 1245     Visit Number 9    Number of Visits 12    Date for PT Re-Evaluation 06/01/23    Authorization Type Francine Graven - auth for 12 visits from 9/23 to 12/16    Authorization - Visit Number 9    Authorization - Number of Visits 12    Progress Note Due on Visit 17    PT Start Time 1300    PT Stop Time 1340    PT Time Calculation (min) 40 min    Activity Tolerance Patient tolerated treatment well    Behavior During Therapy WFL for tasks assessed/performed             Past Medical History:  Diagnosis Date   Acquired cyst of kidney    Cysts on outside & inside of kidneys   Allergy    Anxiety    Arthritis    Asthma    Bronchitis, not specified as acute or chronic    Cancer (HCC)    skin cancer   Cellulitis and abscess of buttock    Cervicalgia    Depression    Diabetes mellitus    Hyperlipidemia    Hypertension    Insomnia    Leg weakness, bilateral    Lumbago    Memory difficulty    Migraine without aura, without mention of intractable migraine without mention of status migrainosus    Osteoarthrosis, unspecified whether generalized or localized, unspecified site    Pain in joint, lower leg    Pain in joint, shoulder region    Proteinuria    Past Surgical History:  Procedure Laterality Date    ABDOMINAL HYSTERECTOMY     Bone Spurs Removed Left    Left Heel   CARPAL TUNNEL RELEASE     rt/lt   CATARACT EXTRACTION     CERVICAL FUSION  2009   COLONOSCOPY     Corrective rectal surgery     CYSTECTOMY     On vertebrae. Rods put in as well   EYE SURGERY     lt cataract   FOOT SURGERY     HERNIA REPAIR  12/02/2011   hypercholesterol     insomnia     KIDNEY SURGERY     cysts on right kidney   LUMBAR LAMINECTOMY/DECOMPRESSION MICRODISCECTOMY Right 10/05/2018   Procedure: Microdiscectomy - right - L2-L3;  Surgeon: Julio Sicks, MD;  Location: [facility] OR;  Service: Neurosurgery;  Laterality: Right;  Microdiscectomy - right - L2-L3   lumbar radiculopathy     SPINE SURGERY  2011   cysts on spine   TOTAL KNEE ARTHROPLASTY     Right knee   TUBAL LIGATION     UMBILICAL HERNIA REPAIR  12/02/2011   Procedure: HERNIA REPAIR UMBILICAL ADULT;  Surgeon: Wilmon Arms. Corliss Skains, MD;  Location: [facility];  Service: General;  Laterality: N/A;  Umbilical hernia repair with mesh   Patient Active Problem List   Diagnosis Date Noted   Lumbar radiculopathy 10/05/2018   Umbilical hernia 11/12/2011   Abdominal pain, lower 10/28/2011    PCP: Richmond Campbell, PA  REFERRING PROVIDER: Julio Sicks, MD  REFERRING DIAG: M43.10 (ICD-10-CM) - Spondylolisthesis, site unspecified  Rationale for Evaluation and Treatment: Rehabilitation  THERAPY DIAG:  Lumbar radiculopathy  Muscle weakness (generalized)  ONSET DATE: progressively worsened  over the last couple years   SUBJECTIVE:                                                                                                                                                                                           SUBJECTIVE STATEMENT: 05/25/2023 States overall she is feeling well, no pain or difficulties. Reports 100% better. States that the exercises help with her pain when.  Eval: States her pain has been really bad. States that she went to  PT before and that didn't help. States that her left groin causes soreness and sets off the pain. States she has pain and difficulties with yard work. States she can stand and move for about 10-15 on a bad day and then she has to sit. But if she sits too long she has pain. Reports pain is in the left groin too. Reports memory problems at baseline.  States she uses a cane for her balance and back pain. Reports it is difficult to get off the floor if she falls but she hasn't fallen in a while.  PERTINENT HISTORY:  DB, stage 3 kidney disease, depression, recent memory changes,  L2-3 10/05/18 right microdisectomy, R TKA,   PAIN:  Are you having pain? Yes: NPRS scale: 4/10 Pain location: right shoulder Pain description: dull/achy/throbbing Aggravating factors: prolonged positioning, moving around,  Relieving factors: laying down   PRECAUTIONS: None  RED FLAGS: None   WEIGHT BEARING RESTRICTIONS: No  FALLS:  Has patient fallen in last 6 months? No (was falling prior to the surgery)  LIVING ENVIRONMENT: Lives with: lives alone Lives in: House/apartment Stairs: Yes: External: 3 steps; none Has following equipment at home: Single point cane, Shower bench, and Grab bars  OCCUPATION: retired   PLOF: Independent with basic ADLs  PATIENT GOALS: increase mobility and reduce pain , work on floor transfer strength  OBJECTIVE:   DIAGNOSTIC FINDINGS:  None on file   PATIENT SURVEYS:  FOTO 40% , 04/13/23 73%  SCREENING FOR RED FLAGS: Bowel or bladder incontinence: No Spinal tumors: No Cauda equina syndrome: No Compression fracture: No Abdominal aneurysm: No  COGNITION: Overall cognitive status: Within functional limits for  tasks assessed     SENSATION: Not tested    POSTURE: rounded shoulders, forward head, posterior pelvic tilt, and flexed trunk   PALPATION: Increased resting tone in glutes at rest - clenches glutes at rest- no tenderness to palpation noted  LUMBAR ROM:    AROM 05/25/23  Flexion 70% limited  Extension 90% limited  Right lateral flexion 25% limited  Left lateral flexion 25% limited  Right rotation   Left rotation    (Blank rows = not tested) *pain in low back    LE Measurements Lower Extremity Right 12/9 Left 12/9   A/PROM MMT A/PROM MMT  Hip Flexion 105 4- 105 4-  Hip Extension      Hip Abduction      Hip Adduction      Hip Internal rotation 10  8   Hip External rotation 45  30   Knee Flexion  4+  4  Knee Extension  4+  4  Ankle Dorsiflexion  4+  4+  Ankle Plantarflexion      Ankle Inversion      Ankle Eversion       (Blank rows = not tested) * pain ** uses groin muscle    GAIT: Distance walked: 25 ft in clinic Assistive device utilized: Single point cane Level of assistance: Modified independence Comments: wide BOS hips ER - limited trunk and hip ROM   TODAY'S TREATMENT:                                                                                                                              DATE:   05/25/2023  Therapeutic Exercise: Seated:   STS 3x10 slow and controlled, LAQs 2x5 5" holds B, piriformis stretch x3 30" holds  Supine: LTR 2 minutes, groin butterfly stretch  5" holds 2 minute bouts,SLR 4x5 B, hip internal rotation 2 minutes  Objective measures updated    Prone: hamstring curls 5" holds 2 minutes alternating  Standing: Neuromuscular Re-education: Manual Therapy:   Therapeutic Activity:   Self Care: Trigger Point Dry Needling:  Modalities:     PATIENT EDUCATION:  Education details: on HEP Person educated: Patient Education method: Explanation, Demonstration, and Handouts Education comprehension: verbalized understanding   HOME EXERCISE PROGRAM: [id number]  ASSESSMENT:  CLINICAL IMPRESSION: 05/25/2023 Overall patient doing well. Updated objective measures and patient doing well. Educated patient in current presentation and importance of continued adherence to HEP. Patient to  discharge from PT to HEP at this time secondary to progress made.    Eval: Patient presents to physical therapy with complaints of chronic low back pain with history of lumbar surgery in 2020.  Patient presents with significant limitations in hip range of motion's and compensatory strategies using groin muscles for ambulation and lower extremity movements.  Educated patient current presentation as well as plan moving forward.  Patient also has deficits with transitional movements and has expressed concerns about safely getting on and off the floor due  to strength and low back pain.  Will incorporate balance and lower extremity strengthening to help improve with these functional tasks at home and in the community.  Patient would greatly benefit from skilled PT to reduce pain and improve transitional movements.  OBJECTIVE IMPAIRMENTS: decreased activity tolerance, decreased knowledge of use of DME, decreased mobility, difficulty walking, decreased ROM, decreased strength, improper body mechanics, postural dysfunction, and pain.   ACTIVITY LIMITATIONS: lifting, sitting, standing, squatting, stairs, transfers, bed mobility, and locomotion level  PARTICIPATION LIMITATIONS: cleaning, community activity, and yard work  PERSONAL FACTORS: Age, Fitness, and 1-2 comorbidities: lumbar surgery, dementia   are also affecting patient's functional outcome.   REHAB POTENTIAL: Good  CLINICAL DECISION MAKING: Stable/uncomplicated  EVALUATION COMPLEXITY: Low   GOALS: Goals reviewed with patient? yes  SHORT TERM GOALS: Target date: 04/20/2023  Patient will be independent in self management strategies to improve quality of life and functional outcomes. Baseline: New Program Goal status: MET  2.  Patient will report at least 25% improvement in overall symptoms and/or function to demonstrate improved functional mobility Baseline: 0% better Goal status: MET  3.  Patient will demonstrate at least 10 degrees of  hip IR to improve hip mobility. Baseline: see above Goal status: PROGRESSING  4.  Patient will demonstrate painfree lumbar ROM in all directions. Baseline: painful Goal status: MET    LONG TERM GOALS: Target date: 06/01/2023  Patient will report at least 50% improvement in overall symptoms and/or function to demonstrate improved functional mobility Baseline: 0% better Goal status: MET  2.  Patient will improve score on FOTO outcomes measure to projected score to demonstrate overall improved function and QOL Baseline: see above Goal status: MET  3.  Patient will be able to transition from sit to stand without UE to demonstrate improved LE strength.  Baseline: unable - uses hands Goal status: MET    PLAN:  PT FREQUENCY: 1-2x/week for a total of 12 visits   PT DURATION: 12 weeks  PLANNED INTERVENTIONS: Therapeutic exercises, Therapeutic activity, Neuromuscular re-education, Balance training, Gait training, Patient/Family education, Self Care, Joint mobilization, Joint manipulation, Stair training, Vestibular training, Canalith repositioning, Orthotic/Fit training, Prosthetic training, DME instructions, Aquatic Therapy, Dry Needling, Electrical stimulation, Spinal manipulation, Spinal mobilization, Cryotherapy, Moist heat, Taping, Traction, Ultrasound, Ionotophoresis 4mg /ml Dexamethasone, Manual therapy, and Re-evaluation.   PLAN FOR NEXT SESSION:  DC to HEP.   1:42 PM, 05/25/23 Tereasa Coop, DPT Physical Therapy with [facility]
